# Patient Record
Sex: Female | Born: 1955 | Race: Black or African American | Hispanic: No | Marital: Married | Smoking: Never smoker
Health system: Southern US, Community
[De-identification: ages and names within clinical notes are randomized; demographics above are authoritative.]

## PROBLEM LIST (undated history)

## (undated) DIAGNOSIS — E739 Lactose intolerance, unspecified: Secondary | ICD-10-CM

## (undated) DIAGNOSIS — R7309 Other abnormal glucose: Secondary | ICD-10-CM

## (undated) DIAGNOSIS — F32A Depression, unspecified: Secondary | ICD-10-CM

## (undated) DIAGNOSIS — F419 Anxiety disorder, unspecified: Secondary | ICD-10-CM

## (undated) DIAGNOSIS — R7989 Other specified abnormal findings of blood chemistry: Secondary | ICD-10-CM

## (undated) DIAGNOSIS — M2550 Pain in unspecified joint: Secondary | ICD-10-CM

## (undated) DIAGNOSIS — Z9109 Other allergy status, other than to drugs and biological substances: Secondary | ICD-10-CM

## (undated) DIAGNOSIS — E559 Vitamin D deficiency, unspecified: Secondary | ICD-10-CM

## (undated) DIAGNOSIS — E669 Obesity, unspecified: Secondary | ICD-10-CM

## (undated) DIAGNOSIS — K589 Irritable bowel syndrome without diarrhea: Secondary | ICD-10-CM

## (undated) DIAGNOSIS — D219 Benign neoplasm of connective and other soft tissue, unspecified: Secondary | ICD-10-CM

## (undated) DIAGNOSIS — K829 Disease of gallbladder, unspecified: Secondary | ICD-10-CM

## (undated) DIAGNOSIS — R7303 Prediabetes: Secondary | ICD-10-CM

## (undated) DIAGNOSIS — K219 Gastro-esophageal reflux disease without esophagitis: Secondary | ICD-10-CM

## (undated) DIAGNOSIS — Z8659 Personal history of other mental and behavioral disorders: Secondary | ICD-10-CM

## (undated) DIAGNOSIS — Z8759 Personal history of other complications of pregnancy, childbirth and the puerperium: Secondary | ICD-10-CM

## (undated) HISTORY — DX: Personal history of other mental and behavioral disorders: Z86.59

## (undated) HISTORY — DX: Other specified abnormal findings of blood chemistry: R79.89

## (undated) HISTORY — DX: Other allergy status, other than to drugs and biological substances: Z91.09

## (undated) HISTORY — DX: Vitamin D deficiency, unspecified: E55.9

## (undated) HISTORY — DX: Disease of gallbladder, unspecified: K82.9

## (undated) HISTORY — DX: Irritable bowel syndrome, unspecified: K58.9

## (undated) HISTORY — DX: Gastro-esophageal reflux disease without esophagitis: K21.9

## (undated) HISTORY — DX: Prediabetes: R73.03

## (undated) HISTORY — DX: Lactose intolerance, unspecified: E73.9

## (undated) HISTORY — DX: Hypermagnesemia: E83.41

## (undated) HISTORY — DX: Obesity, unspecified: E66.9

## (undated) HISTORY — DX: Other abnormal glucose: R73.09

## (undated) HISTORY — DX: Pain in unspecified joint: M25.50

## (undated) HISTORY — DX: Benign neoplasm of connective and other soft tissue, unspecified: D21.9

## (undated) HISTORY — DX: Depression, unspecified: F32.A

## (undated) HISTORY — DX: Anxiety disorder, unspecified: F41.9

## (undated) HISTORY — DX: Personal history of other complications of pregnancy, childbirth and the puerperium: Z87.59

---

## 1985-06-11 DIAGNOSIS — Z8659 Personal history of other mental and behavioral disorders: Secondary | ICD-10-CM

## 1985-06-11 HISTORY — DX: Personal history of other mental and behavioral disorders: Z86.59

## 1998-10-03 ENCOUNTER — Other Ambulatory Visit: Admission: RE | Admit: 1998-10-03 | Discharge: 1998-10-03 | Payer: Self-pay | Admitting: *Deleted

## 1999-10-18 ENCOUNTER — Other Ambulatory Visit: Admission: RE | Admit: 1999-10-18 | Discharge: 1999-10-18 | Payer: Self-pay | Admitting: *Deleted

## 2000-10-24 ENCOUNTER — Other Ambulatory Visit: Admission: RE | Admit: 2000-10-24 | Discharge: 2000-10-24 | Payer: Self-pay | Admitting: *Deleted

## 2002-02-06 ENCOUNTER — Other Ambulatory Visit: Admission: RE | Admit: 2002-02-06 | Discharge: 2002-02-06 | Payer: Self-pay | Admitting: *Deleted

## 2003-06-09 ENCOUNTER — Other Ambulatory Visit: Admission: RE | Admit: 2003-06-09 | Discharge: 2003-06-09 | Payer: Self-pay | Admitting: *Deleted

## 2004-05-10 ENCOUNTER — Emergency Department (HOSPITAL_COMMUNITY): Admission: EM | Admit: 2004-05-10 | Discharge: 2004-05-10 | Payer: Self-pay | Admitting: Emergency Medicine

## 2004-06-12 ENCOUNTER — Other Ambulatory Visit: Admission: RE | Admit: 2004-06-12 | Discharge: 2004-06-12 | Payer: Self-pay | Admitting: *Deleted

## 2005-10-31 ENCOUNTER — Other Ambulatory Visit: Admission: RE | Admit: 2005-10-31 | Discharge: 2005-10-31 | Payer: Self-pay | Admitting: Obstetrics and Gynecology

## 2006-11-18 ENCOUNTER — Other Ambulatory Visit: Admission: RE | Admit: 2006-11-18 | Discharge: 2006-11-18 | Payer: Self-pay | Admitting: Obstetrics and Gynecology

## 2006-12-06 ENCOUNTER — Ambulatory Visit: Payer: Self-pay | Admitting: Internal Medicine

## 2006-12-19 ENCOUNTER — Ambulatory Visit: Payer: Self-pay | Admitting: Internal Medicine

## 2006-12-19 LAB — HM COLONOSCOPY

## 2007-11-26 ENCOUNTER — Other Ambulatory Visit: Admission: RE | Admit: 2007-11-26 | Discharge: 2007-11-26 | Payer: Self-pay | Admitting: Obstetrics and Gynecology

## 2012-04-07 LAB — HM PAP SMEAR: HM Pap smear: NEGATIVE

## 2012-04-07 LAB — HM MAMMOGRAPHY

## 2012-09-17 ENCOUNTER — Other Ambulatory Visit: Payer: Self-pay

## 2012-09-17 ENCOUNTER — Ambulatory Visit
Admission: RE | Admit: 2012-09-17 | Discharge: 2012-09-17 | Disposition: A | Discharge status: Home / Self Care | Payer: 59 | Source: Ambulatory Visit

## 2012-09-17 DIAGNOSIS — M25562 Pain in left knee: Secondary | ICD-10-CM

## 2012-10-27 ENCOUNTER — Telehealth: Payer: Self-pay | Admitting: Obstetrics and Gynecology

## 2012-10-27 NOTE — Telephone Encounter (Signed)
Patient would like

## 2012-11-26 ENCOUNTER — Telehealth: Payer: Self-pay | Admitting: Orthopedic Surgery

## 2012-11-26 NOTE — Telephone Encounter (Signed)
Spoke with pt about PMB. Pt states she had some bleeding last month for about 10 days, and hasn't had a regular period for about 18 months. Pt would like to see CR, but she is out of town until the 26th. Advised pt that CR is also out, but pt does not want to see another provider sooner. Pt agreeable to appt 12-09-12 at 1:45.

## 2012-12-04 ENCOUNTER — Encounter: Payer: Self-pay | Admitting: Obstetrics and Gynecology

## 2012-12-08 ENCOUNTER — Telehealth: Payer: Self-pay | Admitting: Obstetrics and Gynecology

## 2012-12-08 NOTE — Telephone Encounter (Signed)
Pt cancelled problem appt for tomorrow.says she is feeling better and does not need to come in right now.

## 2012-12-09 ENCOUNTER — Ambulatory Visit: Payer: Self-pay | Admitting: Obstetrics and Gynecology

## 2012-12-31 ENCOUNTER — Ambulatory Visit: Payer: Self-pay | Admitting: Obstetrics and Gynecology

## 2013-02-16 ENCOUNTER — Other Ambulatory Visit: Payer: Self-pay | Admitting: Internal Medicine

## 2013-02-16 DIAGNOSIS — R634 Abnormal weight loss: Secondary | ICD-10-CM

## 2013-02-16 DIAGNOSIS — R109 Unspecified abdominal pain: Secondary | ICD-10-CM

## 2013-02-17 ENCOUNTER — Ambulatory Visit
Admission: RE | Admit: 2013-02-17 | Discharge: 2013-02-17 | Disposition: A | Discharge status: Home / Self Care | Payer: 59 | Source: Ambulatory Visit | Attending: Internal Medicine | Admitting: Internal Medicine

## 2013-02-17 DIAGNOSIS — R634 Abnormal weight loss: Secondary | ICD-10-CM

## 2013-02-17 DIAGNOSIS — R109 Unspecified abdominal pain: Secondary | ICD-10-CM

## 2013-02-17 MED ORDER — IOHEXOL 300 MG/ML  SOLN
100.0000 mL | Freq: Once | INTRAMUSCULAR | Status: AC | PRN
  Administered 2013-02-17: 100 mL via INTRAVENOUS

## 2013-04-13 ENCOUNTER — Ambulatory Visit: Payer: Self-pay | Admitting: Obstetrics and Gynecology

## 2013-04-22 ENCOUNTER — Encounter: Payer: Self-pay | Admitting: Obstetrics and Gynecology

## 2013-04-22 ENCOUNTER — Ambulatory Visit (INDEPENDENT_AMBULATORY_CARE_PROVIDER_SITE_OTHER): Payer: 59 | Admitting: Obstetrics and Gynecology

## 2013-04-22 VITALS — BP 100/70 | HR 80 | Ht 61.75 in | Wt 169.0 lb

## 2013-04-22 DIAGNOSIS — Z01419 Encounter for gynecological examination (general) (routine) without abnormal findings: Secondary | ICD-10-CM

## 2013-04-22 DIAGNOSIS — Z0000 Encounter for general adult medical examination without abnormal findings: Secondary | ICD-10-CM

## 2013-04-22 LAB — POCT URINALYSIS DIPSTICK
Bilirubin, UA: NEGATIVE
Blood, UA: NEGATIVE
Glucose, UA: NEGATIVE
Ketones, UA: NEGATIVE
Leukocytes, UA: NEGATIVE
Nitrite, UA: NEGATIVE
Protein, UA: NEGATIVE
Urobilinogen, UA: NEGATIVE
pH, UA: 5

## 2013-04-22 NOTE — Progress Notes (Signed)
Patient ID: Tanya Weber, female   DOB: 09-01-1955, 57 y.o.   MRN: [id number] GYNECOLOGY VISIT  PCP:  Francesca Oman, MD  Referring provider:   HPI: 57 y.o.   Married  [place]  female   [phone] with Patient's last menstrual period was 11/10/2011.   here for  AEX.  No postmenopausal bleeding.   Has known uterine fibroids.  Last ultrasound 2010 - largest fibroid 3.0 cm.  Normal ovaries.   Hgb:     PCP Urine:   Neg  GYNECOLOGIC HISTORY: Patient's last menstrual period was 11/10/2011. Sexually active:  yes Partner preference: female Contraception:   vasectomy Menopausal hormone therapy: no DES exposure: no Blood transfusions:   no Sexually transmitted diseases:   no GYN Procedures:  C-section Mammogram:   04-07-12 MWU:[id number]---[id number] for mammogram 04-24-13 with [facility]              Pap:   04-07-12 wnl:neg HR HPV History of abnormal pap smear:  no   OB History   Grav Para Term Preterm Abortions TAB SAB Ect Mult Living   4 3 3  1  1   3        LIFESTYLE: Exercise:       Walking 3 times per week        Tobacco:        no Alcohol:           no Drug use:        no  OTHER HEALTH MAINTENANCE: Tetanus/TDap:    11/2006 Gardisil:   n/a Influenza:    Allergic to vaccine Zostavax:    n/a  Bone density:    n/a Colonoscopy:    12/2006 wnl: Dr. Stan Head.  Next colonoscopy due 12/2016.  Cholesterol check: 04-07-12 wnl  Family History  Problem Relation Age of Onset  . Heart attack Father 58    deceased  . Diabetes Brother 22    IDDM  . Hypertension Sister     There are no active problems to display for this patient.  Past Medical History  Diagnosis Date  . History of postpartum depression 1987  . Environmental allergies   . Fibroids     Past Surgical History  Procedure Laterality Date  . Cesarean section      ALLERGIES: Review of patient's allergies indicates no known allergies.  Current Outpatient Prescriptions  Medication Sig Dispense  Refill  . Loratadine (CLARITIN PO) Take by mouth.      . Magnesium 250 MG TABS Take by mouth.      . Multiple Vitamins-Minerals (MULTIVITAMIN PO) Take by mouth.      . Omega-3 Fatty Acids (FISH OIL PO) Take by mouth.       No current facility-administered medications for this visit.     ROS:  Pertinent items are noted in HPI.  SOCIAL HISTORY:    PHYSICAL EXAMINATION:    BP 100/70  Pulse 80  Ht 5' 1.75" (1.568 m)  Wt 169 lb (76.658 kg)  BMI 31.18 kg/m2  LMP 11/10/2011   Wt Readings from Last 3 Encounters:  04/22/13 169 lb (76.658 kg)     Ht Readings from Last 3 Encounters:  04/22/13 5' 1.75" (1.568 m)    General appearance: alert, cooperative and appears stated age Head: Normocephalic, without obvious abnormality, atraumatic Neck: no adenopathy, supple, symmetrical, trachea midline and thyroid not enlarged, symmetric, no tenderness/mass/nodules Lungs: clear to auscultation bilaterally Breasts: Inspection negative, No nipple retraction or dimpling, No nipple discharge or bleeding,  No axillary or supraclavicular adenopathy, Normal to palpation without dominant masses Heart: regular rate and rhythm Abdomen: soft, non-tender; no masses,  no organomegaly Extremities: extremities normal, atraumatic, no cyanosis or edema Skin: Skin color, texture, turgor normal. No rashes or lesions Lymph nodes: Cervical, supraclavicular, and axillary nodes normal. No abnormal inguinal nodes palpated Neurologic: Grossly normal  Pelvic: External genitalia:  no lesions              Urethra:  normal appearing urethra with no masses, tenderness or lesions              Bartholins and Skenes: normal                 Vagina: normal appearing vagina with normal color and discharge, no lesions              Cervix: normal appearance              Pap and high risk HPV testing done: no.            Bimanual Exam:  Uterus:  uterus is  8 week size  With palpable fibroids posteriorly and to the patient's left  fundus.                                       Adnexa: normal adnexa in size, nontender and no masses                                      Rectovaginal: Confirms                                      Anus:  normal sphincter tone, no lesions  ASSESSMENT  Uterine fibroids.  Asymptomatic.   PLAN  Mammogram yearly.  Scheduled for this month at [facility]  Pap smear and high risk HPV testing in 2018. Return annually or prn   An After Visit Summary was printed and given to the patient.

## 2013-04-22 NOTE — Patient Instructions (Signed)

## 2014-02-18 ENCOUNTER — Encounter: Payer: Self-pay | Admitting: Obstetrics and Gynecology

## 2014-02-26 ENCOUNTER — Encounter: Payer: Self-pay | Admitting: Obstetrics and Gynecology

## 2014-04-12 ENCOUNTER — Encounter: Payer: Self-pay | Admitting: Obstetrics and Gynecology

## 2014-04-26 ENCOUNTER — Ambulatory Visit: Payer: 59 | Admitting: Obstetrics and Gynecology

## 2014-05-05 ENCOUNTER — Telehealth: Payer: Self-pay | Admitting: Obstetrics and Gynecology

## 2014-05-05 ENCOUNTER — Ambulatory Visit: Payer: 59 | Admitting: Obstetrics and Gynecology

## 2014-05-10 ENCOUNTER — Telehealth: Payer: Self-pay | Admitting: Obstetrics and Gynecology

## 2014-05-10 NOTE — Telephone Encounter (Signed)
Phone call returned to patient on cell phone.  Patient unhappy with rescheduled appointments.   Patient states that communication with her is best through a phone call and not a letter.  She feels that a letter is too passive.  She will keep her appointment on December 14 at 1:30 pm but would prefer a first morning appointment if possible before the end of the year.   I told her that I would have our scheduler look at the possibilities and call her back either way.   I also assured her that we were reassessing our process for rescheduling appointments in order to serve patients better.  Patient was very appreciative of my call.

## 2014-05-24 ENCOUNTER — Ambulatory Visit (INDEPENDENT_AMBULATORY_CARE_PROVIDER_SITE_OTHER): Payer: 59 | Admitting: Obstetrics and Gynecology

## 2014-05-24 ENCOUNTER — Encounter: Payer: Self-pay | Admitting: Obstetrics and Gynecology

## 2014-05-24 VITALS — BP 100/68 | HR 66 | Resp 21 | Ht 61.75 in | Wt 157.0 lb

## 2014-05-24 DIAGNOSIS — Z01419 Encounter for gynecological examination (general) (routine) without abnormal findings: Secondary | ICD-10-CM

## 2014-05-24 DIAGNOSIS — Z0000 Encounter for general adult medical examination without abnormal findings: Secondary | ICD-10-CM

## 2014-05-24 LAB — CBC
HCT: 40.1 % (ref 36.0–46.0)
Hemoglobin: 13.4 g/dL (ref 12.0–15.0)
MCH: 28.3 pg (ref 26.0–34.0)
MCHC: 33.4 g/dL (ref 30.0–36.0)
MCV: 84.6 fL (ref 78.0–100.0)
MPV: 10.4 fL (ref 9.4–12.4)
Platelets: 186 10*3/uL (ref 150–400)
RBC: 4.74 MIL/uL (ref 3.87–5.11)
RDW: 15 % (ref 11.5–15.5)
WBC: 5 10*3/uL (ref 4.0–10.5)

## 2014-05-24 LAB — POCT URINALYSIS DIPSTICK
Bilirubin, UA: NEGATIVE
Blood, UA: NEGATIVE
Glucose, UA: NEGATIVE
Ketones, UA: NEGATIVE
Leukocytes, UA: NEGATIVE
Nitrite, UA: NEGATIVE
Protein, UA: NEGATIVE
Urobilinogen, UA: NEGATIVE
pH, UA: 5

## 2014-05-24 LAB — HEMOGLOBIN, FINGERSTICK: Hemoglobin, fingerstick: 13.1 g/dL (ref 12.0–16.0)

## 2014-05-24 MED ORDER — MAGNESIUM 250 MG PO TABS: 250.0000 mg | ORAL_TABLET | Freq: Every day | ORAL | Status: DC

## 2014-05-24 MED ORDER — LORATADINE 10 MG PO TABS: 10.0000 mg | ORAL_TABLET | Freq: Every day | ORAL | Status: DC | PRN

## 2014-05-24 MED ORDER — FISH OIL 500 MG PO CAPS: 500.0000 mg | ORAL_CAPSULE | Freq: Every day | ORAL | Status: DC

## 2014-05-24 MED ORDER — THERA VITAL M PO TABS: 1.0000 | ORAL_TABLET | Freq: Every day | ORAL | Status: DC

## 2014-05-24 NOTE — Addendum Note (Signed)
Addended by: Tacy Learn, BROOK E on: 05/24/2014 02:30 PM   Modules accepted: Orders

## 2014-05-24 NOTE — Patient Instructions (Signed)

## 2014-05-24 NOTE — Progress Notes (Addendum)
Patient ID: Tanya Weber, female   DOB: [birth year], 58 y.o.   MRN: [id number] 58 y.o. [id number] MarriedAfrican AmericanF here for annual exam.   Wants lab work, vit D, magnesium, cholesterol,metabolic profile, TSH, and CBC.   No postmenopausal bleeding.   Work and family doing well.  Going to [place].   Has known uterine fibroids. Last ultrasound 2010 - largest fibroid 3.0 cm. Normal ovaries.  PCP:  [facility] [place]  Patient's last menstrual period was 11/10/2011.          Sexually active: Yes.  female  The current method of family planning is vasectomy.    Exercising: Yes.    jogging 3x weekly for 1 hour. Smoker:  no  Health Maintenance: Pap:  04-07-12 wnl:neg HR HPV History of abnormal Pap:  no MMG:  04-24-13 fatty breasts/nl:[facility].  Has appt for this week.  Colonoscopy:  12/2006 wnl:Dr. Carlean Purl.  Next colonoscopy due 12/2016. BMD:   --- TDaP:  11/2006 Screening Labs:  Hb today: 13.1, Urine today: Neg   reports that she has never smoked. She does not have any smokeless tobacco history on file. She reports that she does not drink alcohol or use illicit drugs.  Past Medical History  Diagnosis Date  . History of postpartum depression 1987  . Environmental allergies   . Fibroids     Past Surgical History  Procedure Laterality Date  . Cesarean section      Current Outpatient Prescriptions  Medication Sig Dispense Refill  . Loratadine (CLARITIN PO) Take by mouth.    . Magnesium 250 MG TABS Take by mouth.    . Multiple Vitamins-Minerals (MULTIVITAMIN PO) Take by mouth.    . Omega-3 Fatty Acids (FISH OIL PO) Take by mouth.     No current facility-administered medications for this visit.    Family History  Problem Relation Age of Onset  . Heart attack Father 14    deceased  . Diabetes Brother 22    IDDM  . Hypertension Sister     ROS:  Pertinent items are noted in HPI.  Otherwise, a comprehensive ROS was negative.  Exam:   BP 100/68  mmHg  Pulse 66  Resp 21  Ht 5' 1.75" (1.568 m)  Wt 157 lb (71.215 kg)  BMI 28.97 kg/m2  LMP 11/10/2011     Height: 5' 1.75" (156.8 cm)  Ht Readings from Last 3 Encounters:  05/24/14 5' 1.75" (1.568 m)  04/22/13 5' 1.75" (1.568 m)    General appearance: alert, cooperative and appears stated age Head: Normocephalic, without obvious abnormality, atraumatic Neck: no adenopathy, supple, symmetrical, trachea midline and thyroid normal to inspection and palpation Lungs: clear to auscultation bilaterally Breasts: normal appearance, no masses or tenderness, Inspection negative, No nipple retraction or dimpling, No nipple discharge or bleeding, No axillary or supraclavicular adenopathy Heart: regular rate and rhythm Abdomen: soft, non-tender; bowel sounds normal; no masses,  no organomegaly Extremities: extremities normal, atraumatic, no cyanosis or edema Skin: Skin color, texture, turgor normal. No rashes or lesions Lymph nodes: Cervical, supraclavicular, and axillary nodes normal. No abnormal inguinal nodes palpated Neurologic: Grossly normal   Pelvic: External genitalia:  no lesions              Urethra:  normal appearing urethra with no masses, tenderness or lesions              Bartholins and Skenes: normal  Vagina: normal appearing vagina with normal color and discharge, no lesions              Cervix: no lesions              Pap taken: No. Bimanual Exam:  Uterus:  normal size, contour, position, consistency, mobility, non-tender              Adnexa: normal adnexa and no mass, fullness, tenderness               Rectovaginal: Confirms               Anus:  normal sphincter tone, no lesions  Chaperone was present for exam.  A:  Well Woman with normal exam Known fibroid.  Not palpable today.   P:   Mammogram yearly.  pap smear and HPV testing in 2016.  Routine labs - see orders.  return annually or prn  Addendum  Patient requested refills on her multivitamin,  Claritin, Omega 3 fatty acid, and magnesium for her health savings account card use. This was done.

## 2014-05-25 LAB — COMPREHENSIVE METABOLIC PANEL
ALT: 13 U/L (ref 0–35)
AST: 19 U/L (ref 0–37)
Albumin: 4.1 g/dL (ref 3.5–5.2)
Alkaline Phosphatase: 56 U/L (ref 39–117)
BUN: 15 mg/dL (ref 6–23)
CO2: 26 mEq/L (ref 19–32)
Calcium: 9.5 mg/dL (ref 8.4–10.5)
Chloride: 105 mEq/L (ref 96–112)
Creat: 0.71 mg/dL (ref 0.50–1.10)
Glucose, Bld: 76 mg/dL (ref 70–99)
Potassium: 4.3 mEq/L (ref 3.5–5.3)
Sodium: 139 mEq/L (ref 135–145)
Total Bilirubin: 0.5 mg/dL (ref 0.2–1.2)
Total Protein: 6.8 g/dL (ref 6.0–8.3)

## 2014-05-25 LAB — LIPID PANEL
Cholesterol: 163 mg/dL (ref 0–200)
HDL: 62 mg/dL (ref >39)
LDL Cholesterol: 89 mg/dL (ref 0–99)
Total CHOL/HDL Ratio: 2.6 Ratio
Triglycerides: 59 mg/dL (ref <150)
VLDL: 12 mg/dL (ref 0–40)

## 2014-05-25 LAB — MAGNESIUM: Magnesium: 2.1 mg/dL (ref 1.5–2.5)

## 2014-05-25 LAB — VITAMIN D 25 HYDROXY (VIT D DEFICIENCY, FRACTURES): Vit D, 25-Hydroxy: 31 ng/mL (ref 30–100)

## 2014-05-25 LAB — TSH: TSH: 2.171 u[IU]/mL (ref 0.350–4.500)

## 2015-06-15 ENCOUNTER — Ambulatory Visit (INDEPENDENT_AMBULATORY_CARE_PROVIDER_SITE_OTHER): Payer: 59 | Admitting: Obstetrics and Gynecology

## 2015-06-15 ENCOUNTER — Encounter: Payer: Self-pay | Admitting: Obstetrics and Gynecology

## 2015-06-15 VITALS — BP 112/62 | HR 74 | Resp 18 | Ht 62.0 in | Wt 161.0 lb

## 2015-06-15 DIAGNOSIS — Z01419 Encounter for gynecological examination (general) (routine) without abnormal findings: Secondary | ICD-10-CM | POA: Diagnosis not present

## 2015-06-15 DIAGNOSIS — R252 Cramp and spasm: Secondary | ICD-10-CM

## 2015-06-15 DIAGNOSIS — Z0000 Encounter for general adult medical examination without abnormal findings: Secondary | ICD-10-CM

## 2015-06-15 LAB — CBC
HCT: 43.5 % (ref 36.0–46.0)
Hemoglobin: 14 g/dL (ref 12.0–15.0)
MCH: 28.1 pg (ref 26.0–34.0)
MCHC: 32.2 g/dL (ref 30.0–36.0)
MCV: 87.2 fL (ref 78.0–100.0)
MPV: 9.9 fL (ref 8.6–12.4)
Platelets: 180 10*3/uL (ref 150–400)
RBC: 4.99 MIL/uL (ref 3.87–5.11)
RDW: 15.1 % (ref 11.5–15.5)
WBC: 4.1 10*3/uL (ref 4.0–10.5)

## 2015-06-15 LAB — POCT URINALYSIS DIPSTICK
Bilirubin, UA: NEGATIVE
Blood, UA: NEGATIVE
Glucose, UA: NEGATIVE
Ketones, UA: NEGATIVE
Leukocytes, UA: NEGATIVE
Nitrite, UA: NEGATIVE
Protein, UA: NEGATIVE
Urobilinogen, UA: NEGATIVE
pH, UA: 5

## 2015-06-15 LAB — TSH: TSH: 3.804 u[IU]/mL (ref 0.350–4.500)

## 2015-06-15 LAB — HEMOGLOBIN, FINGERSTICK: Hemoglobin, fingerstick: 13.8 g/dL (ref 12.0–16.0)

## 2015-06-15 NOTE — Progress Notes (Signed)
60 y.o. [id number] Married [place] American female here for annual exam.    Sharp pain in the right lower quadrant comes and goes when "exicted."  Once every 2 months.  No change in bowel function.  No postmenopausal bleeding.  Has known fibroids.   Has cramps in legs with exercise.  Takes vit D 2000 IU three times a week.  Uses soy milk.  Wants lipids, TSH, CMP, CBC, magnesium, vit D.  Is a clinician.   PCP:   [facility]      Patient's last menstrual period was 11/10/2011.          Sexually active: Yes.    The current method of family planning is post menopausal status.    Exercising: Yes.    Walking Smoker:  no  Health Maintenance: Pap:  04/07/12 Neg. HR HPV:neg History of abnormal Pap:  no MMG:  05/31/15 BIRADS1:neg Colonoscopy:  12/2006 Repeat 10 years  BMD:   Never  TDaP: 11/2006 Screening Labs:  Hb today:  13.8, Urine today: Negative   reports that she has never smoked. She has never used smokeless tobacco. She reports that she does not drink alcohol or use illicit drugs.  Past Medical History  Diagnosis Date  . History of postpartum depression 1987  . Environmental allergies   . Fibroids     Past Surgical History  Procedure Laterality Date  . Cesarean section      Current Outpatient Prescriptions  Medication Sig Dispense Refill  . cholecalciferol (VITAMIN D) 1000 units tablet Take 1,000 Units by mouth daily.    Marland Kitchen loratadine (CLARITIN) 10 MG tablet Take 1 tablet (10 mg total) by mouth daily as needed. 30 tablet 11  . Magnesium 250 MG TABS Take 1 tablet (250 mg total) by mouth daily. 30 tablet 11  . Multiple Vitamins-Minerals (MULTIVITAMIN) tablet Take 1 tablet by mouth daily. 30 tablet 11  . Omega-3 Fatty Acids (FISH OIL) 500 MG CAPS Take 1 capsule (500 mg total) by mouth daily. 30 capsule 11   No current facility-administered medications for this visit.    Family History  Problem Relation Age of Onset  . Heart attack Father 76    deceased  .  Diabetes Brother 22    IDDM  . Hypertension Sister     ROS:  Pertinent items are noted in HPI.  Otherwise, a comprehensive ROS was negative.  Exam:   BP 112/62 mmHg  Pulse 74  Resp 18  Ht 5\' 2"  (1.575 m)  Wt 161 lb (73.029 kg)  BMI 29.44 kg/m2  LMP 11/10/2011    General appearance: alert, cooperative and appears stated age Head: Normocephalic, without obvious abnormality, atraumatic Neck: no adenopathy, supple, symmetrical, trachea midline and thyroid normal to inspection and palpation Lungs: clear to auscultation bilaterally Breasts: normal appearance, no masses or tenderness, Inspection negative, No nipple retraction or dimpling, No nipple discharge or bleeding, No axillary or supraclavicular adenopathy Heart: regular rate and rhythm Abdomen: Pfannenstiel incision, soft, non-tender; bowel sounds normal; no masses,  no organomegaly Extremities: extremities normal, atraumatic, no cyanosis or edema Skin: Skin color, texture, turgor normal. No rashes or lesions Lymph nodes: Cervical, supraclavicular, and axillary nodes normal. No abnormal inguinal nodes palpated Neurologic: Grossly normal  Pelvic: External genitalia:  no lesions              Urethra:  normal appearing urethra with no masses, tenderness or lesions              Bartholins and Skenes:  normal                 Vagina: normal appearing vagina with normal color and discharge, no lesions              Cervix: no lesions              Pap taken: Yes.   Bimanual Exam:  Uterus:  normal size, contour, position, consistency, mobility, non-tender              Adnexa: normal adnexa and no mass, fullness, tenderness              Rectovaginal: Yes.  .  Confirms.              Anus:  normal sphincter tone, no lesions  Chaperone was present for exam.  Assessment:   Well woman visit with normal exam. Right lower quadrant pain.  Nonspecific.  Hx fibroids. Leg cramping.   Plan: Yearly mammogram recommended after age 55.   Recommended self breast exam.  Pap and HR HPV as above. Discussed Calcium, Vitamin D, regular exercise program including cardiovascular and weight bearing exercise. Labs performed.  Yes.  .   See orders.  Routine labs and magnesium added. Refills given on medications.  No..  See orders. Return for increased abdominal pain, occurrence of vaginal bleeding or any other concern.  Follow up annually and prn.      After visit summary provided.

## 2015-06-15 NOTE — Patient Instructions (Signed)

## 2015-06-16 LAB — LIPID PANEL
Cholesterol: 202 mg/dL — ABNORMAL HIGH (ref 125–200)
HDL: 82 mg/dL (ref >=46)
LDL Cholesterol: 108 mg/dL (ref <130)
Total CHOL/HDL Ratio: 2.5 Ratio (ref <=5.0)
Triglycerides: 60 mg/dL (ref <150)
VLDL: 12 mg/dL (ref <30)

## 2015-06-16 LAB — COMPREHENSIVE METABOLIC PANEL
ALT: 16 U/L (ref 6–29)
AST: 21 U/L (ref 10–35)
Albumin: 4.2 g/dL (ref 3.6–5.1)
Alkaline Phosphatase: 55 U/L (ref 33–130)
BUN: 16 mg/dL (ref 7–25)
CO2: 26 mmol/L (ref 20–31)
Calcium: 9.2 mg/dL (ref 8.6–10.4)
Chloride: 107 mmol/L (ref 98–110)
Creat: 0.8 mg/dL (ref 0.50–1.05)
Glucose, Bld: 79 mg/dL (ref 65–99)
Potassium: 3.8 mmol/L (ref 3.5–5.3)
Sodium: 142 mmol/L (ref 135–146)
Total Bilirubin: 0.5 mg/dL (ref 0.2–1.2)
Total Protein: 6.9 g/dL (ref 6.1–8.1)

## 2015-06-16 LAB — VITAMIN D 25 HYDROXY (VIT D DEFICIENCY, FRACTURES): Vit D, 25-Hydroxy: 44 ng/mL (ref 30–100)

## 2015-06-16 LAB — MAGNESIUM: Magnesium: 2.1 mg/dL (ref 1.5–2.5)

## 2015-06-17 LAB — IPS PAP TEST WITH HPV

## 2015-06-21 ENCOUNTER — Telehealth: Payer: Self-pay | Admitting: Emergency Medicine

## 2015-06-21 ENCOUNTER — Encounter: Payer: Self-pay | Admitting: Obstetrics and Gynecology

## 2015-06-21 NOTE — Telephone Encounter (Signed)
-----   Message from Nunzio Cobbs, MD sent at 06/16/2015  8:17 PM EST ----- Please inform patient of her lab results. Her total cholesterol was 202, but this is because her HDL was so high at 82.  Great job! Her TSH, magnesium, vit D, CBC, and CMP were all normal.  I am very pleased with her blood work.   Pap is still pending.

## 2015-06-21 NOTE — Telephone Encounter (Signed)
-----   Message from Nunzio Cobbs, MD sent at 06/18/2015  2:29 PM EST ----- Please inform patient of lab results: Pap showed LGSIL and negative HR HPV.  Preferred management according to ASCCP guidelines is costesting in one year.  Please enter recall - 08.   Cc- Marisa Sprinkles

## 2015-06-21 NOTE — Telephone Encounter (Signed)
Patient contacted and informed of lab results and pap smear results, LGSIL.  Follow up in one year.  08 recall in place, patient has annual exam scheduled.  Verbalizes understanding of all results and importance for pap smear in one year.  Will call back with any further concerns.  Routing to provider for final review. Patient agreeable to disposition. Will close encounter.

## 2015-06-22 NOTE — Telephone Encounter (Signed)
Responded to patient via mychart.  Route to provider for review.  Will close [employer].

## 2016-04-26 ENCOUNTER — Other Ambulatory Visit: Payer: Self-pay | Admitting: Occupational Medicine

## 2016-04-26 ENCOUNTER — Ambulatory Visit: Payer: Self-pay

## 2016-04-26 DIAGNOSIS — Z0000 Encounter for general adult medical examination without abnormal findings: Secondary | ICD-10-CM

## 2016-06-06 ENCOUNTER — Encounter: Payer: Self-pay | Admitting: Obstetrics and Gynecology

## 2016-07-05 NOTE — Progress Notes (Signed)
61 y.o. [id number] Married [place] American female here for annual exam.    Ate some spicy food 3 days ago. and having abdominal pain.  Pap last year was LGSIL and negative HR HPV.   Wants screening labs today.   PCP: [facility]   Patient's last menstrual period was 11/10/2011.           Sexually active: Yes.    The current method of family planning is post menopausal status.    Exercising: Yes.    walking Smoker:  no  Health Maintenance: Pap:  06-15-15 LGSIL:Neg HR HPV History of abnormal Pap:  Ye, 06-15-15 LGSIL:Neg HR HPV MMG:  05-17-16 Density A/Neg/BiRads1:[facility] Colonoscopy:  12/2006 normal;next due 12/2016 BMD:  N/A TDaP:  11/2006 HIV: unsure Hep C: unsure Screening Labs:  Hb today: 13.3, Urine today: RBC= Small, WBC=Trace.  Asymptomatic.   reports that she has never smoked. She has never used smokeless tobacco. She reports that she does not drink alcohol or use drugs.  Past Medical History:  Diagnosis Date  . Environmental allergies   . Fibroids   . History of postpartum depression 1987    Past Surgical History:  Procedure Laterality Date  . CESAREAN SECTION      Current Outpatient Prescriptions  Medication Sig Dispense Refill  . cholecalciferol (VITAMIN D) 1000 units tablet Take 1,000 Units by mouth daily.    Marland Kitchen loratadine (CLARITIN) 10 MG tablet Take 1 tablet (10 mg total) by mouth daily as needed. 30 tablet 11  . Magnesium 250 MG TABS Take 1 tablet (250 mg total) by mouth daily. 30 tablet 11  . Multiple Vitamins-Minerals (MULTIVITAMIN) tablet Take 1 tablet by mouth daily. 30 tablet 11  . Omega-3 Fatty Acids (FISH OIL) 500 MG CAPS Take 1 capsule (500 mg total) by mouth daily. 30 capsule 11   No current facility-administered medications for this visit.     Family History  Problem Relation Age of Onset  . Heart attack Father 61    deceased  . Diabetes Brother 22    IDDM  . Hypertension Sister     ROS:  Pertinent items are noted in HPI.  Otherwise, a  comprehensive ROS was negative.  Exam:   BP 104/70 (BP Location: Right Arm, Patient Position: Sitting, Cuff Size: Large)   Pulse 76   Resp 18   Ht 5\' 2"  (1.575 m)   Wt 168 lb (76.2 kg)   LMP 11/10/2011   BMI 30.73 kg/m     General appearance: alert, cooperative and appears stated age Head: Normocephalic, without obvious abnormality, atraumatic Neck: no adenopathy, supple, symmetrical, trachea midline and thyroid normal to inspection and palpation Lungs: clear to auscultation bilaterally Breasts: normal appearance, no masses or tenderness, No nipple retraction or dimpling, No nipple discharge or bleeding, No axillary or supraclavicular adenopathy Heart: regular rate and rhythm Abdomen: soft, non-tender; no masses, no organomegaly Extremities: extremities normal, atraumatic, no cyanosis or edema Skin: Skin color, texture, turgor normal. No rashes or lesions Lymph nodes: Cervical, supraclavicular, and axillary nodes normal. No abnormal inguinal nodes palpated Neurologic: Grossly normal  Pelvic: External genitalia:  no lesions              Urethra:  normal appearing urethra with no masses, tenderness or lesions              Bartholins and Skenes: normal                 Vagina: normal appearing vagina with normal color  and discharge, no lesions              Cervix: no lesions              Pap taken: Yes.   Bimanual Exam:  Uterus:  normal size, contour, position, consistency, mobility, non-tender              Adnexa: no mass, fullness, tenderness              Rectal exam: Yes.  .  Confirms.              Anus:  normal sphincter tone, no lesions  Chaperone was present for exam.  Assessment:   Well woman visit with normal exam. Hx fibroids.  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  Microscopic hematuria.   Plan: Mammogram screening discussed. Recommended self breast awareness. Pap and HR HPV performed according to ASCCP guidelines.   If pap abnormal or positive HR HPV, needs colpo.   This explained to patient.  Routine labs including hep C and HIV. Urine micro and culture. TDap today.  She will contact [facility] to schedule colonoscopy.  Guidelines for Calcium, Vitamin D, regular exercise program including cardiovascular and weight bearing exercise.   Follow up annually and prn.        After visit summary provided.

## 2016-07-06 ENCOUNTER — Other Ambulatory Visit: Payer: Self-pay | Admitting: Obstetrics and Gynecology

## 2016-07-06 ENCOUNTER — Encounter: Payer: Self-pay | Admitting: Obstetrics and Gynecology

## 2016-07-06 ENCOUNTER — Ambulatory Visit (INDEPENDENT_AMBULATORY_CARE_PROVIDER_SITE_OTHER): Payer: 59 | Admitting: Obstetrics and Gynecology

## 2016-07-06 VITALS — BP 104/70 | HR 76 | Resp 18 | Ht 62.0 in | Wt 168.0 lb

## 2016-07-06 DIAGNOSIS — Z23 Encounter for immunization: Secondary | ICD-10-CM

## 2016-07-06 DIAGNOSIS — R3129 Other microscopic hematuria: Secondary | ICD-10-CM

## 2016-07-06 DIAGNOSIS — Z119 Encounter for screening for infectious and parasitic diseases, unspecified: Secondary | ICD-10-CM | POA: Diagnosis not present

## 2016-07-06 DIAGNOSIS — Z01419 Encounter for gynecological examination (general) (routine) without abnormal findings: Secondary | ICD-10-CM | POA: Diagnosis not present

## 2016-07-06 DIAGNOSIS — Z0000 Encounter for general adult medical examination without abnormal findings: Secondary | ICD-10-CM | POA: Diagnosis not present

## 2016-07-06 DIAGNOSIS — R7989 Other specified abnormal findings of blood chemistry: Secondary | ICD-10-CM

## 2016-07-06 LAB — POCT URINALYSIS DIPSTICK
Bilirubin, UA: NEGATIVE
Glucose, UA: NEGATIVE
Ketones, UA: NEGATIVE
Nitrite, UA: NEGATIVE
Protein, UA: NEGATIVE
Urobilinogen, UA: NEGATIVE
pH, UA: 5

## 2016-07-06 LAB — CBC
HCT: 43.9 % (ref 35.0–45.0)
Hemoglobin: 14 g/dL (ref 11.7–15.5)
MCH: 28.5 pg (ref 27.0–33.0)
MCHC: 31.9 g/dL — ABNORMAL LOW (ref 32.0–36.0)
MCV: 89.4 fL (ref 80.0–100.0)
MPV: 10.7 fL (ref 7.5–12.5)
PLATELETS: 177 10*3/uL (ref 140–400)
RBC: 4.91 MIL/uL (ref 3.80–5.10)
RDW: 14.5 % (ref 11.0–15.0)
WBC: 5 10*3/uL (ref 3.8–10.8)

## 2016-07-06 LAB — LIPID PANEL
Cholesterol: 197 mg/dL (ref <200)
HDL: 81 mg/dL (ref >50)
LDL Cholesterol: 105 mg/dL — ABNORMAL HIGH (ref <100)
Total CHOL/HDL Ratio: 2.4 Ratio (ref <5.0)
Triglycerides: 54 mg/dL (ref <150)
VLDL: 11 mg/dL (ref <30)

## 2016-07-06 LAB — COMPREHENSIVE METABOLIC PANEL
ALK PHOS: 57 U/L (ref 33–130)
ALT: 18 U/L (ref 6–29)
AST: 21 U/L (ref 10–35)
Albumin: 4.1 g/dL (ref 3.6–5.1)
BUN: 14 mg/dL (ref 7–25)
CHLORIDE: 106 mmol/L (ref 98–110)
CO2: 26 mmol/L (ref 20–31)
CREATININE: 0.83 mg/dL (ref 0.50–1.05)
Calcium: 9.7 mg/dL (ref 8.6–10.4)
GLUCOSE: 92 mg/dL (ref 65–99)
Potassium: 4.4 mmol/L (ref 3.5–5.3)
SODIUM: 139 mmol/L (ref 135–146)
TOTAL PROTEIN: 7.3 g/dL (ref 6.1–8.1)
Total Bilirubin: 0.5 mg/dL (ref 0.2–1.2)

## 2016-07-06 LAB — HEMOGLOBIN, FINGERSTICK: Hemoglobin, fingerstick: 13.3 g/dL (ref 12.0–15.0)

## 2016-07-06 LAB — HEPATITIS C ANTIBODY: HCV AB: NEGATIVE

## 2016-07-06 LAB — TSH: TSH: 4.8 mIU/L — ABNORMAL HIGH

## 2016-07-06 NOTE — Patient Instructions (Signed)

## 2016-07-07 LAB — URINALYSIS, MICROSCOPIC ONLY
BACTERIA UA: NONE SEEN [HPF]
Casts: NONE SEEN [LPF]
Crystals: NONE SEEN [HPF]
RBC / HPF: NONE SEEN RBC/HPF (ref <=2)
Squamous Epithelial / LPF: NONE SEEN [HPF] (ref <=5)
YEAST: NONE SEEN [HPF]

## 2016-07-07 LAB — VITAMIN D 25 HYDROXY (VIT D DEFICIENCY, FRACTURES): VIT D 25 HYDROXY: 41 ng/mL (ref 30–100)

## 2016-07-07 LAB — HIV ANTIBODY (ROUTINE TESTING W REFLEX): HIV 1&2 Ab, 4th Generation: NONREACTIVE

## 2016-07-08 LAB — URINE CULTURE: Organism ID, Bacteria: NO GROWTH

## 2016-07-09 ENCOUNTER — Other Ambulatory Visit: Payer: Self-pay | Admitting: *Deleted

## 2016-07-09 LAB — T4, FREE: FREE T4: 1.1 ng/dL (ref 0.8–1.8)

## 2016-07-09 LAB — T3, FREE: T3 FREE: 3.1 pg/mL (ref 2.3–4.2)

## 2016-07-10 NOTE — Addendum Note (Signed)
Addended by: Yisroel Ramming, Dietrich Pates E on: 07/10/2016 07:06 PM   Modules accepted: Orders

## 2016-07-11 ENCOUNTER — Telehealth: Payer: Self-pay

## 2016-07-11 NOTE — Telephone Encounter (Signed)
Spoke with patient. Advised of message and results as seen below from [place]. Patient is agreeable and verbalizes understanding. Patient is driving and is not able to make a follow up lab appointment at this time. She will return call to the office to schedule 3 month recheck at the end of April.  Routing to provider for final review. Patient agreeable to disposition. Will close encounter.

## 2016-07-11 NOTE — Telephone Encounter (Signed)
-----   Message from Nunzio Cobbs, MD sent at 07/10/2016  7:06 PM EST ----- Results to patient through My Chart. Please call to schedule lab visit for 3 months.   Hello Tanya Weber,   I am sharing good news that you free T4 and free T3 are normal.  In light of your slightly elevated TSH, I am recommending that you return for a lab visit to recheck your thyroid function in 3 months.  We want to understand if you are developing hypothyroidism.  I will have the nurse call you to schedule this lab visit.   Thank you,   Josefa Half, MD  Cc- Marisa Sprinkles

## 2016-07-12 LAB — IPS PAP TEST WITH HPV

## 2016-07-13 ENCOUNTER — Encounter: Payer: Self-pay | Admitting: Obstetrics and Gynecology

## 2016-07-17 ENCOUNTER — Other Ambulatory Visit: Payer: Self-pay | Admitting: Obstetrics and Gynecology

## 2016-07-17 DIAGNOSIS — Z77018 Contact with and (suspected) exposure to other hazardous metals: Secondary | ICD-10-CM

## 2016-10-08 ENCOUNTER — Other Ambulatory Visit (INDEPENDENT_AMBULATORY_CARE_PROVIDER_SITE_OTHER): Payer: 59

## 2016-10-08 DIAGNOSIS — R7989 Other specified abnormal findings of blood chemistry: Secondary | ICD-10-CM

## 2016-10-08 DIAGNOSIS — Z77018 Contact with and (suspected) exposure to other hazardous metals: Secondary | ICD-10-CM

## 2016-10-08 DIAGNOSIS — R946 Abnormal results of thyroid function studies: Secondary | ICD-10-CM

## 2016-10-09 LAB — THYROID PANEL WITH TSH
FREE THYROXINE INDEX: 2.5 (ref 1.4–3.8)
T3 Uptake: 30 % (ref 22–35)
T4, Total: 8.2 ug/dL (ref 4.5–12.0)
TSH: 4.22 m[IU]/L

## 2016-10-10 LAB — HEAVY METALS PANEL, BLOOD
ARSENIC: 4 ug/L (ref <23)
Lead: <1 ug/dL (ref <5)
Mercury, B: 4 mcg/L (ref <=10)

## 2017-02-05 ENCOUNTER — Encounter: Payer: Self-pay | Admitting: Internal Medicine

## 2017-04-26 ENCOUNTER — Other Ambulatory Visit: Payer: Self-pay | Admitting: Obstetrics and Gynecology

## 2017-04-26 DIAGNOSIS — Z139 Encounter for screening, unspecified: Secondary | ICD-10-CM

## 2017-06-07 ENCOUNTER — Ambulatory Visit
Admission: RE | Admit: 2017-06-07 | Discharge: 2017-06-07 | Disposition: A | Discharge status: Home / Self Care | Payer: 59 | Source: Ambulatory Visit | Attending: Obstetrics and Gynecology | Admitting: Obstetrics and Gynecology

## 2017-06-07 DIAGNOSIS — Z139 Encounter for screening, unspecified: Secondary | ICD-10-CM

## 2017-06-11 HISTORY — DX: Hypermagnesemia: E83.41

## 2017-07-19 ENCOUNTER — Encounter: Payer: Self-pay | Admitting: Obstetrics and Gynecology

## 2017-07-19 ENCOUNTER — Ambulatory Visit (INDEPENDENT_AMBULATORY_CARE_PROVIDER_SITE_OTHER): Payer: 59 | Admitting: Obstetrics and Gynecology

## 2017-07-19 ENCOUNTER — Other Ambulatory Visit (HOSPITAL_COMMUNITY)
Admission: RE | Admit: 2017-07-19 | Discharge: 2017-07-19 | Disposition: A | Discharge status: Home / Self Care | Payer: 59 | Source: Ambulatory Visit | Attending: Obstetrics and Gynecology | Admitting: Obstetrics and Gynecology

## 2017-07-19 ENCOUNTER — Other Ambulatory Visit: Payer: Self-pay

## 2017-07-19 VITALS — BP 118/70 | HR 84 | Resp 16 | Ht 62.0 in | Wt 166.0 lb

## 2017-07-19 DIAGNOSIS — F419 Anxiety disorder, unspecified: Secondary | ICD-10-CM

## 2017-07-19 DIAGNOSIS — G4700 Insomnia, unspecified: Secondary | ICD-10-CM | POA: Insufficient documentation

## 2017-07-19 DIAGNOSIS — Z1151 Encounter for screening for human papillomavirus (HPV): Secondary | ICD-10-CM | POA: Insufficient documentation

## 2017-07-19 DIAGNOSIS — Z833 Family history of diabetes mellitus: Secondary | ICD-10-CM | POA: Diagnosis not present

## 2017-07-19 DIAGNOSIS — Z634 Disappearance and death of family member: Secondary | ICD-10-CM | POA: Diagnosis not present

## 2017-07-19 DIAGNOSIS — N952 Postmenopausal atrophic vaginitis: Secondary | ICD-10-CM | POA: Diagnosis not present

## 2017-07-19 DIAGNOSIS — Z01419 Encounter for gynecological examination (general) (routine) without abnormal findings: Secondary | ICD-10-CM

## 2017-07-19 DIAGNOSIS — Z8249 Family history of ischemic heart disease and other diseases of the circulatory system: Secondary | ICD-10-CM | POA: Insufficient documentation

## 2017-07-19 DIAGNOSIS — Z8742 Personal history of other diseases of the female genital tract: Secondary | ICD-10-CM | POA: Insufficient documentation

## 2017-07-19 DIAGNOSIS — R7989 Other specified abnormal findings of blood chemistry: Secondary | ICD-10-CM

## 2017-07-19 HISTORY — DX: Other specified abnormal findings of blood chemistry: R79.89

## 2017-07-19 NOTE — Progress Notes (Addendum)
62 y.o. [id number] Married [place] American female here for annual exam.    More vaginal dryness.   Sleeping less.  Niece died suddenly.   Thinks this had something to do with it.  Feeling anxious about this.  Asking for specific labs today.   PCP:    Dr. Deniece Ree [facility]  Patient's last menstrual period was 11/10/2011.           Sexually active: Yes.    The current method of family planning is post menopausal status.    Exercising: Yes.    walking, weights, zumba Smoker:  no  Health Maintenance: Pap:  07/06/16 Pap and HR HPV negative History of abnormal Pap:  Yes, 06-15-15 LGSIL:Neg HR HPV MMG:  06/07/17 BIRADS 1 negative/density b Colonoscopy:  Cologuard done 2018 per patient normal BMD:   n/a  Result  n/a TDaP:  11/17/16 Gardasil:   n/a HIV and Hep C: 07/06/16 Negative Screening Labs:  Discuss today   reports that  has never smoked. she has never used smokeless tobacco. She reports that she does not drink alcohol or use drugs.  Past Medical History:  Diagnosis Date  . Environmental allergies   . Fibroids   . History of postpartum depression 1987    Past Surgical History:  Procedure Laterality Date  . CESAREAN SECTION      Current Outpatient Medications  Medication Sig Dispense Refill  . cholecalciferol (VITAMIN D) 1000 units tablet Take 1,000 Units by mouth daily.    Marland Kitchen loratadine (CLARITIN) 10 MG tablet Take 1 tablet (10 mg total) by mouth daily as needed. 30 tablet 11  . Magnesium 250 MG TABS Take 1 tablet (250 mg total) by mouth daily. 30 tablet 11  . Multiple Vitamins-Minerals (MULTIVITAMIN) tablet Take 1 tablet by mouth daily. 30 tablet 11  . Omega-3 Fatty Acids (FISH OIL) 500 MG CAPS Take 1 capsule (500 mg total) by mouth daily. 30 capsule 11   No current facility-administered medications for this visit.     Family History  Problem Relation Age of Onset  . Heart attack Father 54       deceased  . Diabetes Brother 22       IDDM  . Hypertension  Sister     ROS:  Pertinent items are noted in HPI.  Otherwise, a comprehensive ROS was negative.  Exam:   BP 118/70 (BP Location: Right Arm, Patient Position: Sitting, Cuff Size: Large)   Pulse 84   Resp 16   Ht 5\' 2"  (1.575 m)   Wt 166 lb (75.3 kg)   LMP 11/10/2011   BMI 30.36 kg/m     General appearance: alert, cooperative and appears stated age Head: Normocephalic, without obvious abnormality, atraumatic Neck: no adenopathy, supple, symmetrical, trachea midline and thyroid normal to inspection and palpation Lungs: clear to auscultation bilaterally Breasts: normal appearance, no masses or tenderness, No nipple retraction or dimpling, No nipple discharge or bleeding, No axillary or supraclavicular adenopathy Heart: regular rate and rhythm Abdomen: soft, non-tender; no masses, no organomegaly Extremities: extremities normal, atraumatic, no cyanosis or edema Skin: Skin color, texture, turgor normal. No rashes or lesions Lymph nodes: Cervical, supraclavicular, and axillary nodes normal. No abnormal inguinal nodes palpated Neurologic: Grossly normal  Pelvic: External genitalia:  7 mm left labia majora subcutaneous cyst.               Urethra:  normal appearing urethra with no masses, tenderness or lesions  Bartholins and Skenes: normal                 Vagina: normal appearing vagina with normal color and discharge, no lesions              Cervix: no lesions              Pap taken: Yes.   Bimanual Exam:  Uterus:  normal size, contour, position, consistency, mobility, non-tender              Adnexa: no mass, fullness, tenderness              Rectal exam: Yes.  .  Confirms.              Anus:  normal sphincter tone, no lesions  Chaperone was present for exam.  Assessment:   Well woman visit with normal exam. Hx fibroids.  Hx LGSIL and neg HR HPV. Vaginal atrophy.  Bereavement with insomnia.  Plan: Mammogram screening discussed. Recommended self breast  awareness. Pap and HR HPV as above. Guidelines for Calcium, Vitamin D, regular exercise program including cardiovascular and weight bearing exercise. Routine labs and Mg, B12, and testosterone per request.  Discussed cooking oils, vit E suppositories, and vaginal estrogen.  Declines vaginal estrogen.  I did discuss the potential for breast cancer with this. Bereavement support.  I recommended counseling for loss through Hospice. Follow up annually and prn.   After visit summary provided.

## 2017-07-19 NOTE — Patient Instructions (Signed)

## 2017-07-22 ENCOUNTER — Other Ambulatory Visit: Payer: Self-pay | Admitting: Obstetrics and Gynecology

## 2017-07-22 ENCOUNTER — Encounter: Payer: Self-pay | Admitting: Obstetrics and Gynecology

## 2017-07-23 LAB — CYTOLOGY - PAP
Diagnosis: NEGATIVE
HPV (WINDOPATH): NOT DETECTED

## 2017-07-24 LAB — COMPREHENSIVE METABOLIC PANEL
A/G RATIO: 1.4 (ref 1.2–2.2)
ALBUMIN: 4.2 g/dL (ref 3.6–4.8)
ALT: 13 IU/L (ref 0–32)
AST: 18 IU/L (ref 0–40)
Alkaline Phosphatase: 64 IU/L (ref 39–117)
BUN / CREAT RATIO: 13 (ref 12–28)
BUN: 12 mg/dL (ref 8–27)
CO2: 21 mmol/L (ref 20–29)
Calcium: 9.3 mg/dL (ref 8.7–10.3)
Chloride: 107 mmol/L — ABNORMAL HIGH (ref 96–106)
Creatinine, Ser: 0.91 mg/dL (ref 0.57–1.00)
GFR calc Af Amer: 79 mL/min/{1.73_m2} (ref >59)
GFR calc non Af Amer: 69 mL/min/{1.73_m2} (ref >59)
GLOBULIN, TOTAL: 2.9 g/dL (ref 1.5–4.5)
Glucose: 92 mg/dL (ref 65–99)
POTASSIUM: 4.3 mmol/L (ref 3.5–5.2)
SODIUM: 144 mmol/L (ref 134–144)
Total Protein: 7.1 g/dL (ref 6.0–8.5)

## 2017-07-24 LAB — LIPID PANEL
CHOL/HDL RATIO: 2.4 ratio (ref 0.0–4.4)
CHOLESTEROL TOTAL: 178 mg/dL (ref 100–199)
HDL: 73 mg/dL (ref >39)
LDL Calculated: 95 mg/dL (ref 0–99)
TRIGLYCERIDES: 49 mg/dL (ref 0–149)
VLDL Cholesterol Cal: 10 mg/dL (ref 5–40)

## 2017-07-24 LAB — TESTOSTERONE, FREE, DIRECT
TESTOSTERONE, TOTAL: 40.6 ng/dL — AB (ref 7.0–40.0)
Testosterone, Free: 0.7 pg/mL (ref 0.0–4.2)

## 2017-07-24 LAB — CBC
HEMATOCRIT: 42.8 % (ref 34.0–46.6)
Hemoglobin: 13.3 g/dL (ref 11.1–15.9)
MCH: 28.2 pg (ref 26.6–33.0)
MCHC: 31.1 g/dL — AB (ref 31.5–35.7)
MCV: 91 fL (ref 79–97)
Platelets: 201 10*3/uL (ref 150–379)
RBC: 4.72 x10E6/uL (ref 3.77–5.28)
RDW: 14.6 % (ref 12.3–15.4)
WBC: 3.9 10*3/uL (ref 3.4–10.8)

## 2017-07-24 LAB — VITAMIN B12: Vitamin B-12: 715 pg/mL (ref 232–1245)

## 2017-07-24 LAB — VITAMIN D 25 HYDROXY (VIT D DEFICIENCY, FRACTURES): Vit D, 25-Hydroxy: 31.1 ng/mL (ref 30.0–100.0)

## 2017-07-24 LAB — MAGNESIUM: Magnesium: 2.8 mg/dL — ABNORMAL HIGH (ref 1.6–2.3)

## 2017-07-24 LAB — TSH: TSH: 5.66 u[IU]/mL — ABNORMAL HIGH (ref 0.450–4.500)

## 2017-07-25 ENCOUNTER — Encounter: Payer: Self-pay | Admitting: Obstetrics and Gynecology

## 2017-07-25 ENCOUNTER — Other Ambulatory Visit: Payer: Self-pay | Admitting: Obstetrics and Gynecology

## 2017-07-25 DIAGNOSIS — R7989 Other specified abnormal findings of blood chemistry: Secondary | ICD-10-CM

## 2017-07-27 LAB — SPECIMEN STATUS REPORT

## 2017-07-27 LAB — T3, FREE: T3 FREE: 2.3 pg/mL (ref 2.0–4.4)

## 2017-07-27 LAB — T4, FREE: Free T4: 1.1 ng/dL (ref 0.82–1.77)

## 2017-08-24 ENCOUNTER — Telehealth: Payer: Self-pay | Admitting: Obstetrics and Gynecology

## 2017-08-24 NOTE — Telephone Encounter (Signed)
Please remind patient she is due to have a lab visit for a magnesium recheck. She came up in my Epic reminder box as an overdue lab.

## 2017-08-26 NOTE — Telephone Encounter (Signed)
Attempted to reach patient at number provided [phone], there was no answer and recording states that the voicemail box is full.

## 2017-08-26 NOTE — Telephone Encounter (Signed)
Spoke with patient. Lab appointment scheduled for 08/30/2017 at 8:40 am. Patient is agreeable to date and time.  Routing to provider for final review. Patient agreeable to disposition. Will close encounter.

## 2017-08-30 ENCOUNTER — Other Ambulatory Visit (INDEPENDENT_AMBULATORY_CARE_PROVIDER_SITE_OTHER): Payer: 59

## 2017-08-31 LAB — MAGNESIUM: MAGNESIUM: 2.3 mg/dL (ref 1.6–2.3)

## 2017-10-02 IMAGING — CR DG CHEST 1V
1 series · 1 of 1 positions shown · non-contrast
Comparison: None.

CLINICAL DATA: Physical examination.  Positive TB test.

EXAM:
CHEST 1 VIEW

[view not recorded]
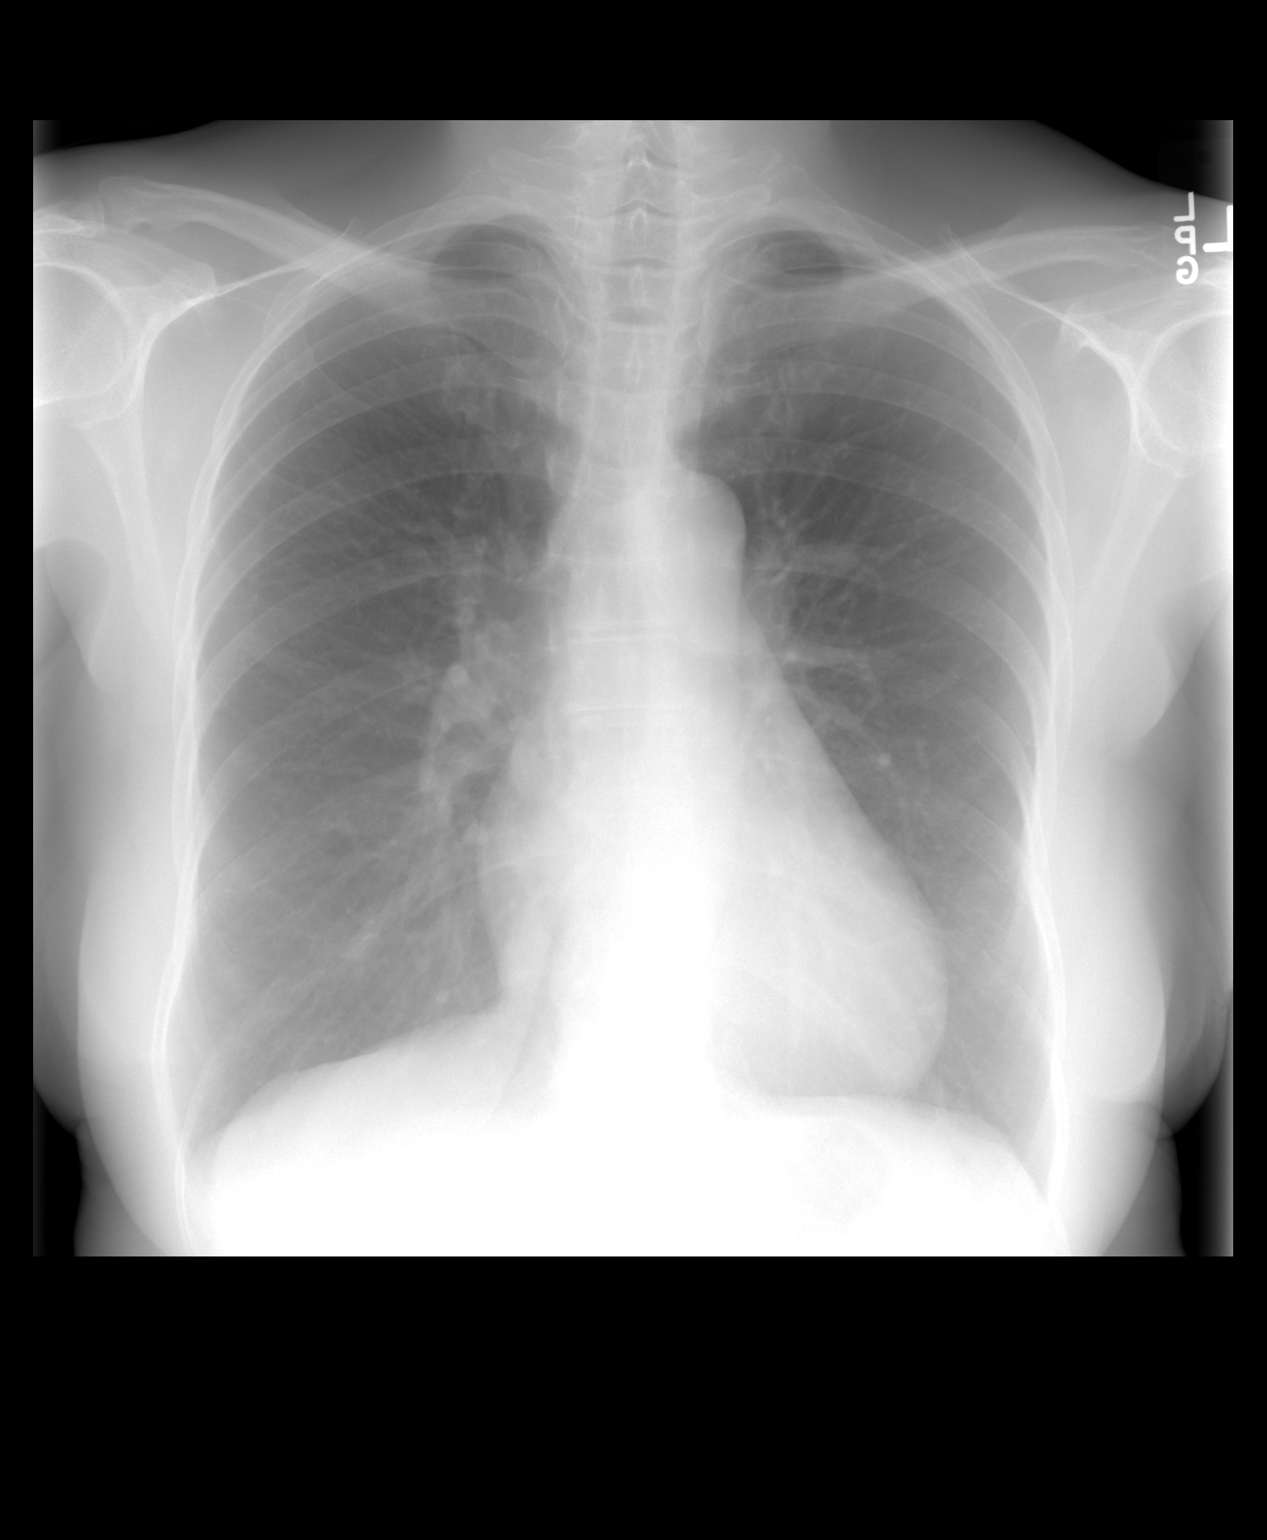

[1 of 1 positions shown; findings below may reference images not displayed]

FINDINGS: Lungs are clear. Heart size is normal. No pneumothorax or pleural
effusion. No bony abnormality.
IMPRESSION: Negative chest.

## 2017-10-09 DIAGNOSIS — R7989 Other specified abnormal findings of blood chemistry: Secondary | ICD-10-CM

## 2017-10-09 HISTORY — DX: Other specified abnormal findings of blood chemistry: R79.89

## 2017-11-01 ENCOUNTER — Other Ambulatory Visit (INDEPENDENT_AMBULATORY_CARE_PROVIDER_SITE_OTHER): Payer: 59

## 2017-11-01 DIAGNOSIS — R7989 Other specified abnormal findings of blood chemistry: Secondary | ICD-10-CM

## 2017-11-05 LAB — TESTOSTERONE, FREE, DIRECT
TESTOSTERONE, TOTAL: 40.8 ng/dL — AB (ref 7.0–40.0)
Testosterone, Free: 1.8 pg/mL (ref 0.0–4.2)

## 2017-11-05 LAB — THYROID PANEL WITH TSH
FREE THYROXINE INDEX: 1.7 (ref 1.2–4.9)
T3 UPTAKE RATIO: 25 % (ref 24–39)
T4, Total: 6.9 ug/dL (ref 4.5–12.0)
TSH: 5.52 u[IU]/mL — ABNORMAL HIGH (ref 0.450–4.500)

## 2017-11-07 ENCOUNTER — Encounter: Payer: Self-pay | Admitting: Obstetrics and Gynecology

## 2018-05-12 ENCOUNTER — Other Ambulatory Visit: Payer: Self-pay | Admitting: Obstetrics and Gynecology

## 2018-05-12 DIAGNOSIS — Z1231 Encounter for screening mammogram for malignant neoplasm of breast: Secondary | ICD-10-CM

## 2018-06-09 ENCOUNTER — Ambulatory Visit
Admission: RE | Admit: 2018-06-09 | Discharge: 2018-06-09 | Disposition: A | Discharge status: Home / Self Care | Payer: 59 | Source: Ambulatory Visit | Attending: Obstetrics and Gynecology | Admitting: Obstetrics and Gynecology

## 2018-06-09 DIAGNOSIS — Z1231 Encounter for screening mammogram for malignant neoplasm of breast: Secondary | ICD-10-CM

## 2018-07-24 NOTE — Progress Notes (Signed)
63 y.o. [id number] Married [place] American female here for annual exam.    Patient is complaining of burning with urination. Symptoms for 2 weeks.  Notes that her food choices affect her urinary symptoms.  Acidic food affect her.  Feels that menopause is affecting her.  Denies dysuria but feels burning in the vagina.  The vaginal symptoms are really bothering her.   No fever, nausea or vomiting.  Some back pain with sitting for a long time.   Asking for comprehensive labs and a pap.   Urine dip:  Trace RBCs.   PCP: London Pepper, MD [facility]  Patient's last menstrual period was 11/10/2011.           Sexually active: Yes.   female The current method of family planning is post menopausal status.    Exercising: Yes.    walking, treadmill, weights Smoker:  no  Health Maintenance: Pap: 07-19-17 Neg:Neg HR HPV, 07-06-16 Neg:Neg HR HPV History of abnormal Pap:  Yes, 06-15-15 LGSIL:Neg HR HPV MMG: 06-09-18 3D Neg/density B/BiRads1 Colonoscopy: Cologuard 2018 normal per patient BMD: n/a  Result  n/a TDaP:  11-17-16 Gardasil:   no HIV: 07-06-16 NR Hep C: 07-06-16 Neg Screening Labs: ---   reports that she has never smoked. She has never used smokeless tobacco. She reports that she does not drink alcohol or use drugs.  Past Medical History:  Diagnosis Date  . Elevated testosterone level in female 10/2017   normal free testosterone  . Elevated TSH 07/19/2017   Normal free T3 and free T4  . Environmental allergies   . Fibroids   . History of postpartum depression 1987  . Hypermagnesemia 2019    Past Surgical History:  Procedure Laterality Date  . CESAREAN SECTION      Current Outpatient Medications  Medication Sig Dispense Refill  . loratadine (CLARITIN) 10 MG tablet Take 1 tablet (10 mg total) by mouth daily as needed. 30 tablet 11  . cholecalciferol (VITAMIN D) 1000 units tablet Take 1,000 Units by mouth daily.     No current facility-administered medications for this visit.      Family History  Problem Relation Age of Onset  . Heart attack Father 55       deceased  . Diabetes Brother 22       IDDM  . Hypertension Sister     Review of Systems  Genitourinary: Positive for dysuria.  All other systems reviewed and are negative.   Exam:   BP 106/60 (BP Location: Right Arm, Patient Position: Sitting, Cuff Size: Large)   Pulse 70   Resp (!) 22   Ht 5' 1.5" (1.562 m)   Wt 176 lb (79.8 kg)   LMP 11/10/2011   BMI 32.72 kg/m     General appearance: alert, cooperative and appears stated age Head: Normocephalic, without obvious abnormality, atraumatic Neck: no adenopathy, supple, symmetrical, trachea midline and thyroid normal to inspection and palpation Lungs: clear to auscultation bilaterally Breasts: normal appearance, no masses or tenderness, No nipple retraction or dimpling, No nipple discharge or bleeding, No axillary or supraclavicular adenopathy Heart: regular rate and rhythm Abdomen: soft, non-tender; no masses, no organomegaly Extremities: extremities normal, atraumatic, no cyanosis or edema Skin: Skin color, texture, turgor normal. No rashes or lesions Lymph nodes: Cervical, supraclavicular, and axillary nodes normal. No abnormal inguinal nodes palpated Neurologic: Grossly normal  Pelvic: External genitalia:  no lesions              Urethra:  normal appearing urethra  with no masses, tenderness or lesions              Bartholins and Skenes: normal                 Vagina: normal appearing vagina with normal color and discharge, no lesions              Cervix: no lesions              Pap taken: Yes.   Bimanual Exam:  Uterus:  normal size, small 1 cm fibroids noted, position, consistency, mobility, non-tender              Adnexa: no mass, fullness, tenderness              Rectal exam: Yes.  .  Confirms.              Anus:  normal sphincter tone, no lesions  Chaperone was present for exam.  Assessment:   Well woman visit with normal  exam. Food intolerances.  Vaginal burning.  Hx LGSIL.  Hx fibroids.   Plan: Mammogram screening. Recommended self breast awareness. Pap and HR HPV as above. Guidelines for Calcium, Vitamin D, regular exercise program including cardiovascular and weight bearing exercise. Vaginitis testing from pap.  She will start Premarin vaginal cream.  Instructed in use.  Discussed potential effect on breast cancer.  Comprehensive labs.  She will follow up with her GI regarding her digestive issues.  Follow up annually and prn.   After visit summary provided.

## 2018-07-25 ENCOUNTER — Other Ambulatory Visit (HOSPITAL_COMMUNITY)
Admission: RE | Admit: 2018-07-25 | Discharge: 2018-07-25 | Disposition: A | Discharge status: Home / Self Care | Payer: 59 | Source: Ambulatory Visit | Attending: Obstetrics and Gynecology | Admitting: Obstetrics and Gynecology

## 2018-07-25 ENCOUNTER — Encounter: Payer: Self-pay | Admitting: Obstetrics and Gynecology

## 2018-07-25 ENCOUNTER — Ambulatory Visit (INDEPENDENT_AMBULATORY_CARE_PROVIDER_SITE_OTHER): Payer: 59 | Admitting: Obstetrics and Gynecology

## 2018-07-25 ENCOUNTER — Other Ambulatory Visit: Payer: Self-pay

## 2018-07-25 VITALS — BP 106/60 | HR 70 | Resp 22 | Ht 61.5 in | Wt 176.0 lb

## 2018-07-25 DIAGNOSIS — R300 Dysuria: Secondary | ICD-10-CM

## 2018-07-25 DIAGNOSIS — Z01419 Encounter for gynecological examination (general) (routine) without abnormal findings: Secondary | ICD-10-CM

## 2018-07-25 DIAGNOSIS — N760 Acute vaginitis: Secondary | ICD-10-CM | POA: Insufficient documentation

## 2018-07-25 LAB — POCT URINALYSIS DIPSTICK
Bilirubin, UA: NEGATIVE
Glucose, UA: NEGATIVE
Ketones, UA: NEGATIVE
LEUKOCYTES UA: NEGATIVE
NITRITE UA: NEGATIVE
PH UA: 5 (ref 5.0–8.0)
PROTEIN UA: NEGATIVE
UROBILINOGEN UA: 0.2 U/dL

## 2018-07-25 MED ORDER — ESTROGENS, CONJUGATED 0.625 MG/GM VA CREA: TOPICAL_CREAM | VAGINAL | 2 refills | Status: DC

## 2018-07-25 NOTE — Patient Instructions (Signed)

## 2018-07-26 LAB — COMPREHENSIVE METABOLIC PANEL
ALBUMIN: 4.1 g/dL (ref 3.8–4.8)
ALK PHOS: 66 IU/L (ref 39–117)
ALT: 10 IU/L (ref 0–32)
AST: 20 IU/L (ref 0–40)
Albumin/Globulin Ratio: 1.5 (ref 1.2–2.2)
BUN / CREAT RATIO: 15 (ref 12–28)
BUN: 12 mg/dL (ref 8–27)
Bilirubin Total: 0.3 mg/dL (ref 0.0–1.2)
CALCIUM: 9.3 mg/dL (ref 8.7–10.3)
CO2: 25 mmol/L (ref 20–29)
CREATININE: 0.8 mg/dL (ref 0.57–1.00)
Chloride: 105 mmol/L (ref 96–106)
GFR calc Af Amer: 92 mL/min/{1.73_m2} (ref >59)
GFR calc non Af Amer: 80 mL/min/{1.73_m2} (ref >59)
GLUCOSE: 82 mg/dL (ref 65–99)
Globulin, Total: 2.8 g/dL (ref 1.5–4.5)
Potassium: 4.3 mmol/L (ref 3.5–5.2)
Sodium: 144 mmol/L (ref 134–144)
Total Protein: 6.9 g/dL (ref 6.0–8.5)

## 2018-07-26 LAB — CBC
HEMATOCRIT: 43.1 % (ref 34.0–46.6)
Hemoglobin: 13.5 g/dL (ref 11.1–15.9)
MCH: 28 pg (ref 26.6–33.0)
MCHC: 31.3 g/dL — ABNORMAL LOW (ref 31.5–35.7)
MCV: 89 fL (ref 79–97)
PLATELETS: 177 10*3/uL (ref 150–450)
RBC: 4.83 x10E6/uL (ref 3.77–5.28)
RDW: 14 % (ref 11.7–15.4)
WBC: 4 10*3/uL (ref 3.4–10.8)

## 2018-07-26 LAB — LIPID PANEL
Chol/HDL Ratio: 2.8 ratio (ref 0.0–4.4)
Cholesterol, Total: 205 mg/dL — ABNORMAL HIGH (ref 100–199)
HDL: 72 mg/dL (ref >39)
LDL Calculated: 121 mg/dL — ABNORMAL HIGH (ref 0–99)
Triglycerides: 58 mg/dL (ref 0–149)
VLDL CHOLESTEROL CAL: 12 mg/dL (ref 5–40)

## 2018-07-26 LAB — HEMOGLOBIN A1C
ESTIMATED AVERAGE GLUCOSE: 117 mg/dL
Hgb A1c MFr Bld: 5.7 % — ABNORMAL HIGH (ref 4.8–5.6)

## 2018-07-26 LAB — TSH: TSH: 3.99 u[IU]/mL (ref 0.450–4.500)

## 2018-07-26 LAB — VITAMIN D 25 HYDROXY (VIT D DEFICIENCY, FRACTURES): Vit D, 25-Hydroxy: 28.9 ng/mL — ABNORMAL LOW (ref 30.0–100.0)

## 2018-07-29 ENCOUNTER — Encounter: Payer: Self-pay | Admitting: Obstetrics and Gynecology

## 2018-07-29 LAB — CYTOLOGY - PAP
Bacterial vaginitis: NEGATIVE
CANDIDA VAGINITIS: NEGATIVE
Diagnosis: NEGATIVE
HPV (WINDOPATH): NOT DETECTED
Trichomonas: NEGATIVE

## 2018-08-28 ENCOUNTER — Other Ambulatory Visit: Payer: Self-pay

## 2018-08-28 ENCOUNTER — Encounter (INDEPENDENT_AMBULATORY_CARE_PROVIDER_SITE_OTHER): Payer: 59

## 2018-09-08 ENCOUNTER — Ambulatory Visit (INDEPENDENT_AMBULATORY_CARE_PROVIDER_SITE_OTHER): Payer: 59 | Admitting: Bariatrics

## 2018-09-23 ENCOUNTER — Ambulatory Visit (INDEPENDENT_AMBULATORY_CARE_PROVIDER_SITE_OTHER): Payer: 59 | Admitting: Bariatrics

## 2018-11-03 ENCOUNTER — Encounter (INDEPENDENT_AMBULATORY_CARE_PROVIDER_SITE_OTHER): Payer: Self-pay

## 2018-12-04 ENCOUNTER — Other Ambulatory Visit: Payer: Self-pay | Admitting: Gastroenterology

## 2018-12-04 DIAGNOSIS — R1010 Upper abdominal pain, unspecified: Secondary | ICD-10-CM

## 2018-12-16 ENCOUNTER — Ambulatory Visit
Admission: RE | Admit: 2018-12-16 | Discharge: 2018-12-16 | Disposition: A | Discharge status: Home / Self Care | Payer: 59 | Source: Ambulatory Visit | Attending: Gastroenterology | Admitting: Gastroenterology

## 2018-12-16 DIAGNOSIS — R1010 Upper abdominal pain, unspecified: Secondary | ICD-10-CM

## 2018-12-29 ENCOUNTER — Other Ambulatory Visit: Payer: Self-pay | Admitting: Surgery

## 2019-01-01 ENCOUNTER — Other Ambulatory Visit: Payer: Self-pay | Admitting: Surgery

## 2019-01-01 DIAGNOSIS — K8020 Calculus of gallbladder without cholecystitis without obstruction: Secondary | ICD-10-CM

## 2019-01-12 ENCOUNTER — Ambulatory Visit
Admission: RE | Admit: 2019-01-12 | Discharge: 2019-01-12 | Disposition: A | Discharge status: Home / Self Care | Payer: 59 | Source: Ambulatory Visit | Attending: Surgery | Admitting: Surgery

## 2019-01-12 DIAGNOSIS — K8020 Calculus of gallbladder without cholecystitis without obstruction: Secondary | ICD-10-CM

## 2019-01-12 MED ORDER — IOPAMIDOL (ISOVUE-300) INJECTION 61%
100.0000 mL | Freq: Once | INTRAVENOUS | Status: AC | PRN
  Administered 2019-01-12: 100 mL via INTRAVENOUS

## 2019-01-14 ENCOUNTER — Telehealth: Payer: Self-pay | Admitting: Obstetrics and Gynecology

## 2019-01-14 NOTE — Telephone Encounter (Signed)
Call to patient. Patient states that she has seen her [facility] for bloating, fullness and RLQ pain. Patient states they did an [place] that showed gallstones, but the gallstones did not explain her symptoms. Was referred for a CT scan which showed fibroids. Patient states she is still having the dull, aching pressure and would like to discuss with Dr. Quincy Simmonds. States the pain is always there and she takes tylenol and applies heat as needed. OV scheduled for 01-21-2019 at 1400 with Dr. Quincy Simmonds. Patient agreeable to date and time of appointment.   Routing to provider and will close encounter.

## 2019-01-14 NOTE — Telephone Encounter (Signed)
Patient is having issues with fibroids.

## 2019-01-19 ENCOUNTER — Other Ambulatory Visit: Payer: Self-pay | Admitting: Surgery

## 2019-01-19 ENCOUNTER — Other Ambulatory Visit: Payer: Self-pay

## 2019-01-20 NOTE — Progress Notes (Signed)
GYNECOLOGY  VISIT   HPI: 63 y.o.   Married  [place] American  female   [phone] with Patient's last menstrual period was 11/10/2011.   here for evaluation of pelvic pain and lower back pain. Had PUS and CT scan with GI which revealed gallstones and fibroids.  States she pain is a deep pain in the right lower quadrant wrapping around to her back.  She also had bloating. Decreased appetite. She saw a Education officer, environmental, show suggested it could also be a hernia.   CT abdomen and pelvis is documenting several fibroids, similar in appearance to prior study.  No adnexal lesions.   She also had a diastasis.  Normal appendix.  Prior abdominal US showing gallstones.   She states her food sensitivities are now better.   Some vaginal burning with urination.  No odor.  Eating acidic food makes for some pain with urination.  She had negative vaginitis testing in Feb. 2020.   No partner change.   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   GYNECOLOGIC HISTORY: Patient's last menstrual period was 11/10/2011. Contraception: Postmenopausal Menopausal hormone therapy: Premarin cream Last mammogram: 06-09-18 3D Neg/density b/BiRads1 Last pap smear: 07-25-18 Neg:Neg HR HPV                             07-19-17 Neg:Neg HR HPV OB History    Gravida  4   Para  3   Term  3   Preterm      AB  1   Living  3     SAB  1   TAB      Ectopic      Multiple      Live Births                 There are no active problems to display for this patient.   Past Medical History:  Diagnosis Date  . Elevated testosterone level in female 10/2017   normal free testosterone  . Elevated TSH 07/19/2017   Normal free T3 and free T4  . Environmental allergies   . Fibroids   . History of postpartum depression 1987  . Hypermagnesemia 2019    Past Surgical History:  Procedure Laterality Date  . CESAREAN SECTION      Current Outpatient Medications  Medication Sig Dispense Refill  .  cholecalciferol (VITAMIN D) 1000 units tablet Take 1,000 Units by mouth daily.    . famotidine (PEPCID) 20 MG tablet Take 20 mg by mouth as needed for heartburn or indigestion.    . conjugated estrogens (PREMARIN) vaginal cream Use 1/2 g vaginally every night at bed time for the first 2 weeks, then use 1/2 g vaginally two or three times per week. (Patient not taking: Reported on 01/21/2019) 30 g 2   No current facility-administered medications for this visit.      ALLERGIES: Patient has no known allergies.  Family History  Problem Relation Age of Onset  . Heart attack Father 20       deceased  . Diabetes Brother 22       IDDM  . Hypertension Sister     Social History   Socioeconomic History  . Marital status: Married    Spouse name: Not on file  . Number of children: Not on file  . Years of education: Not on file  . Highest education level: Not on file  Occupational History  . Not on file  Social Needs  . Financial resource strain: Not on file  . Food insecurity    Worry: Not on file    Inability: Not on file  . Transportation needs    Medical: Not on file    Non-medical: Not on file  Tobacco Use  . Smoking status: Never Smoker  . Smokeless tobacco: Never Used  Substance and Sexual Activity  . Alcohol use: No    Alcohol/week: 0.0 standard drinks  . Drug use: No  . Sexual activity: Yes    Partners: Male    Birth control/protection: Other-see comments, Post-menopausal    Comment: vasectomy  Lifestyle  . Physical activity    Days per week: Not on file    Minutes per session: Not on file  . Stress: Not on file  Relationships  . Social Herbalist on phone: Not on file    Gets together: Not on file    Attends religious service: Not on file    Active member of club or organization: Not on file    Attends meetings of clubs or organizations: Not on file    Relationship status: Not on file  . Intimate partner violence    Fear of current or ex partner: Not  on file    Emotionally abused: Not on file    Physically abused: Not on file    Forced sexual activity: Not on file  Other Topics Concern  . Not on file  Social History Narrative  . Not on file    Review of Systems  All other systems reviewed and are negative.   PHYSICAL EXAMINATION:    BP 110/74 (Cuff Size: Large)   Pulse 66   Temp (!) 97.3 F (36.3 C) (Temporal)   Resp 20   Ht 5' 1.5" (1.562 m)   Wt 182 lb (82.6 kg)   LMP 11/10/2011   BMI 33.83 kg/m     General appearance: alert, cooperative and appears stated age  Pelvic: External genitalia:  no lesions              Urethra:  normal appearing urethra with no masses, tenderness or lesions              Bartholins and Skenes: normal                 Vagina: normal appearing vagina with normal color and discharge, no lesions              Cervix: no lesions                Bimanual Exam:  Uterus:  normal size, contour, position, consistency, mobility, non-tender              Adnexa: no mass, fullness, tenderness              Rectal exam: Yes.  .  Confirms.              Anus:  normal sphincter tone, no lesions  Chaperone was present for exam.  ASSESSMENT  RLQ pain.  Bloating. Fibroids.  Cholelithiasis.  Diastasis rectii.  Dysuria.   PLAN  Discussion of potential causes of pain.  We reviewed fibroids.  Return for pelvic US to evaluate fibroids and adnexal regions. Urine culture.   An After Visit Summary was printed and given to the patient.  _15____ minutes face to face time of which over 50% was spent in counseling.

## 2019-01-21 ENCOUNTER — Encounter: Payer: Self-pay | Admitting: Obstetrics and Gynecology

## 2019-01-21 ENCOUNTER — Ambulatory Visit (INDEPENDENT_AMBULATORY_CARE_PROVIDER_SITE_OTHER): Payer: 59 | Admitting: Obstetrics and Gynecology

## 2019-01-21 ENCOUNTER — Telehealth: Payer: Self-pay | Admitting: Obstetrics and Gynecology

## 2019-01-21 ENCOUNTER — Other Ambulatory Visit: Payer: Self-pay

## 2019-01-21 VITALS — BP 110/74 | HR 66 | Temp 97.3°F | Resp 20 | Ht 61.5 in | Wt 182.0 lb

## 2019-01-21 DIAGNOSIS — R1031 Right lower quadrant pain: Secondary | ICD-10-CM | POA: Diagnosis not present

## 2019-01-21 DIAGNOSIS — D219 Benign neoplasm of connective and other soft tissue, unspecified: Secondary | ICD-10-CM | POA: Diagnosis not present

## 2019-01-21 DIAGNOSIS — R300 Dysuria: Secondary | ICD-10-CM

## 2019-01-21 NOTE — Patient Instructions (Signed)

## 2019-01-21 NOTE — Telephone Encounter (Signed)
Call placed to patient to schedule recommended ultrasound. Unable to leave a message as the voicemail is full

## 2019-01-22 NOTE — Telephone Encounter (Signed)
Patient returned call. Reviewed benefit for recommended ultrasound. Patient acknowledges understanding of information presented. Patient is ready to proceed with scheduling. Patient is scheduled 01/27/2019 with Dr Quincy Simmonds. Patient is aware of the appointment date, arrival time and cancellation policy. No further questions. Will close encounter

## 2019-01-23 ENCOUNTER — Other Ambulatory Visit: Payer: Self-pay

## 2019-01-23 LAB — URINE CULTURE

## 2019-01-27 ENCOUNTER — Other Ambulatory Visit: Payer: Self-pay

## 2019-01-27 ENCOUNTER — Ambulatory Visit (INDEPENDENT_AMBULATORY_CARE_PROVIDER_SITE_OTHER): Payer: 59 | Admitting: Obstetrics and Gynecology

## 2019-01-27 ENCOUNTER — Encounter: Payer: Self-pay | Admitting: Obstetrics and Gynecology

## 2019-01-27 ENCOUNTER — Ambulatory Visit (INDEPENDENT_AMBULATORY_CARE_PROVIDER_SITE_OTHER): Payer: 59

## 2019-01-27 VITALS — BP 126/80 | HR 60 | Temp 97.1°F | Ht 61.5 in | Wt 184.2 lb

## 2019-01-27 DIAGNOSIS — N838 Other noninflammatory disorders of ovary, fallopian tube and broad ligament: Secondary | ICD-10-CM

## 2019-01-27 DIAGNOSIS — R1031 Right lower quadrant pain: Secondary | ICD-10-CM | POA: Insufficient documentation

## 2019-01-27 DIAGNOSIS — D219 Benign neoplasm of connective and other soft tissue, unspecified: Secondary | ICD-10-CM | POA: Insufficient documentation

## 2019-01-27 NOTE — Progress Notes (Signed)
GYNECOLOGY  VISIT   HPI: 63 y.o.   Married  [place]  female   [phone] with Patient's last menstrual period was 11/10/2011.   here for pelvic ultrasound for RLQ pain and known fibroids.  She also has bloating.   CT showed fibroids in the uterus.  Prior US showed gallstones.   GYNECOLOGIC HISTORY: Patient's last menstrual period was 11/10/2011. Contraception: Postmenopausal Menopausal hormone therapy:  Premarin cream Last mammogram: 06-09-18 3D Neg/density b/BiRads1 Last pap smear:  07-25-18 Neg:Neg HR HPV                             07-19-17 Neg:Neg HR HPV        OB History    Gravida  4   Para  3   Term  3   Preterm      AB  1   Living  3     SAB  1   TAB      Ectopic      Multiple      Live Births                 There are no active problems to display for this patient.   Past Medical History:  Diagnosis Date  . Elevated testosterone level in female 10/2017   normal free testosterone  . Elevated TSH 07/19/2017   Normal free T3 and free T4  . Environmental allergies   . Fibroids   . History of postpartum depression 1987  . Hypermagnesemia 2019    Past Surgical History:  Procedure Laterality Date  . CESAREAN SECTION      Current Outpatient Medications  Medication Sig Dispense Refill  . cholecalciferol (VITAMIN D) 1000 units tablet Take 1,000 Units by mouth daily.    Marland Kitchen conjugated estrogens (PREMARIN) vaginal cream Use 1/2 g vaginally every night at bed time for the first 2 weeks, then use 1/2 g vaginally two or three times per week. 30 g 2  . famotidine (PEPCID) 20 MG tablet Take 20 mg by mouth as needed for heartburn or indigestion.     No current facility-administered medications for this visit.      ALLERGIES: Patient has no known allergies.  Family History  Problem Relation Age of Onset  . Heart attack Father 61       deceased  . Diabetes Brother 22       IDDM  . Hypertension Sister     Social History   Socioeconomic  History  . Marital status: Married    Spouse name: Not on file  . Number of children: Not on file  . Years of education: Not on file  . Highest education level: Not on file  Occupational History  . Not on file  Social Needs  . Financial resource strain: Not on file  . Food insecurity    Worry: Not on file    Inability: Not on file  . Transportation needs    Medical: Not on file    Non-medical: Not on file  Tobacco Use  . Smoking status: Never Smoker  . Smokeless tobacco: Never Used  Substance and Sexual Activity  . Alcohol use: No    Alcohol/week: 0.0 standard drinks  . Drug use: No  . Sexual activity: Yes    Partners: Male    Birth control/protection: Other-see comments, Post-menopausal    Comment: vasectomy  Lifestyle  . Physical activity    Days per  week: Not on file    Minutes per session: Not on file  . Stress: Not on file  Relationships  . Social Herbalist on phone: Not on file    Gets together: Not on file    Attends religious service: Not on file    Active member of club or organization: Not on file    Attends meetings of clubs or organizations: Not on file    Relationship status: Not on file  . Intimate partner violence    Fear of current or ex partner: Not on file    Emotionally abused: Not on file    Physically abused: Not on file    Forced sexual activity: Not on file  Other Topics Concern  . Not on file  Social History Narrative  . Not on file    Review of Systems  All other systems reviewed and are negative.   PHYSICAL EXAMINATION:    BP 126/80   Pulse 60   Temp (!) 97.1 F (36.2 C) (Temporal)   Ht 5' 1.5" (1.562 m)   Wt 184 lb 3.2 oz (83.6 kg)   LMP 11/10/2011   BMI 34.24 kg/m     General appearance: alert, cooperative and appears stated age   Pelvic US  Uterus with 2 fibroids - 0.73 and 2.79 mm. Largest fibroid is fundal and encroaches on the endometrium.  EMS 2.53 mm.  Right ovary normal.  Left ovary with multiple  calcifications, largest 4 mm.  No free fluid.  ASSESSMENT  RLQ pain.  Cholelithiasis.  Uterine fibroids.  Left ovary calcifications.   PLAN  We discussed her pelvic US findings, which are not likely causes for her RLQ pain.  I recommend she return to her general surgeon for further evaluation and treatment of her pain.  FU for annual exam and prn.    An After Visit Summary was printed and given to the patient.  _15_____ minutes face to face time of which over 50% was spent in counseling.

## 2019-02-03 ENCOUNTER — Other Ambulatory Visit: Payer: Self-pay | Admitting: Surgery

## 2019-02-24 NOTE — Pre-Procedure Instructions (Signed)
Amillya Nembhard  02/24/2019      [facility] [address], [place] [id number] [place] [address] Phone: [phone] Fax: [phone]    Your procedure is scheduled on March 02, 2019.  Report to [facility], Entrance "A" at 700 AM.  Call this number if you have problems the morning of surgery:  [phone]   Remember:  Do not eat  after midnight.  You may drink clear liquids until 600 AM.  Clear liquids allowed are: Water, Juice (non-citric and without pulp), Clear Tea, Black Coffee only and Gatorade    Take these medicines the morning of surgery with A SIP OF WATER  Famotidine (Pepcid)-if needed    Day of surgery:  Do not wear jewelry, make-up or nail polish.  Do not wear lotions, powders, or perfumes, or deodorant.  Do not shave 48 hours prior to surgery.    Do not bring valuables to the hospital.  [facility] is not responsible for any belongings or valuables.  Contacts, dentures or bridgework may not be worn into surgery.  Leave your suitcase in the car.  After surgery it may be brought to your room.  For patients admitted to the hospital, discharge time will be determined by your treatment team.  Patients discharged the day of surgery will not be allowed to drive home.    [facility]- Preparing For Surgery  Before surgery, you can play an important role. Because skin is not sterile, your skin needs to be as free of germs as possible. You can reduce the number of germs on your skin by washing with CHG (chlorahexidine gluconate) Soap before surgery.  CHG is an antiseptic cleaner which kills germs and bonds with the skin to continue killing germs even after washing.    Oral Hygiene is also important to reduce your risk of infection.  Remember - BRUSH YOUR TEETH THE MORNING OF SURGERY WITH YOUR REGULAR TOOTHPASTE  Please do not use if you have an allergy to CHG or antibacterial soaps. If your skin becomes  reddened/irritated stop using the CHG.  Do not shave (including legs and underarms) for at least 48 hours prior to first CHG shower. It is OK to shave your face.  Please follow these instructions carefully.   1. Shower the NIGHT BEFORE SURGERY and the MORNING OF SURGERY with CHG.   2. If you chose to wash your hair, wash your hair first as usual with your normal shampoo.  3. After you shampoo, rinse your hair and body thoroughly to remove the shampoo.  4. Use CHG as you would any other liquid soap. You can apply CHG directly to the skin and wash gently with a scrungie or a clean washcloth.   5. Apply the CHG Soap to your body ONLY FROM THE NECK DOWN.  Do not use on open wounds or open sores. Avoid contact with your eyes, ears, mouth and genitals (private parts). Wash Face and genitals (private parts)  with your normal soap.  6. Wash thoroughly, paying special attention to the area where your surgery will be performed.  7. Thoroughly rinse your body with warm water from the neck down.  8. DO NOT shower/wash with your normal soap after using and rinsing off the CHG Soap.  9. Pat yourself dry with a CLEAN TOWEL.  10. Wear CLEAN PAJAMAS to bed the night before surgery, wear comfortable clothes the morning of surgery  11. Place CLEAN SHEETS on your bed the  night of your first shower and DO NOT SLEEP WITH PETS.   Day of Surgery: Shower as above Do not apply any deodorants/lotions.  Please wear clean clothes to the hospital/surgery center.   Remember to brush your teeth WITH YOUR REGULAR TOOTHPASTE.  Please read over the following fact sheets that you were given.

## 2019-02-24 NOTE — Progress Notes (Addendum)
PCP - London Pepper, MD Cardiologist - pt denies  Chest x-ray - pt denies past year-no problem EKG - pt denies  Stress Test - pt denies ECHO - pt denies  Cardiac Cath - pt denies  Sleep Study - pt denies CPAP - n/a  Fasting Blood Sugar - n/a Checks Blood Sugar _____ times a day-n/a  Blood Thinner Instructions: n/a Aspirin Instructions: n/a  Anesthesia review:  No  Patient denies shortness of breath, fever, cough and chest pain at PAT appointment  Patient verbalized understanding of instructions that were given to them at the PAT appointment. Patient was also instructed that they will need to review over the PAT instructions again at home before surgery.   Coronavirus Screening  Have you experienced the following symptoms:  Cough yes/no: No Fever (>100.11F)  yes/no: No Runny nose yes/no: No Sore throat yes/no: No Difficulty breathing/shortness of breath  yes/no: No  Have you or a family member traveled in the last 14 days and where? yes/no: No   If the patient indicates "YES" to the above questions, their PAT will be rescheduled to limit the exposure to others and, the surgeon will be notified. THE PATIENT WILL NEED TO BE ASYMPTOMATIC FOR 14 DAYS.   If the patient is not experiencing any of these symptoms, the PAT nurse will instruct them to NOT bring anyone with them to their appointment since they may have these symptoms or traveled as well.   Please remind your patients and families that hospital visitation restrictions are in effect and the importance of the restrictions.

## 2019-02-25 ENCOUNTER — Encounter (HOSPITAL_COMMUNITY)
Admission: RE | Admit: 2019-02-25 | Discharge: 2019-02-25 | Disposition: A | Discharge status: Home / Self Care | Payer: 59 | Source: Ambulatory Visit | Attending: Surgery | Admitting: Surgery

## 2019-02-25 ENCOUNTER — Other Ambulatory Visit: Payer: Self-pay

## 2019-02-25 ENCOUNTER — Encounter (HOSPITAL_COMMUNITY): Payer: Self-pay

## 2019-02-25 DIAGNOSIS — Z01812 Encounter for preprocedural laboratory examination: Secondary | ICD-10-CM | POA: Diagnosis present

## 2019-02-25 DIAGNOSIS — K8020 Calculus of gallbladder without cholecystitis without obstruction: Secondary | ICD-10-CM | POA: Diagnosis not present

## 2019-02-25 LAB — CBC
HCT: 47.6 % — ABNORMAL HIGH (ref 36.0–46.0)
Hemoglobin: 14.7 g/dL (ref 12.0–15.0)
MCH: 28.9 pg (ref 26.0–34.0)
MCHC: 30.9 g/dL (ref 30.0–36.0)
MCV: 93.5 fL (ref 80.0–100.0)
Platelets: 148 10*3/uL — ABNORMAL LOW (ref 150–400)
RBC: 5.09 MIL/uL (ref 3.87–5.11)
RDW: 14.5 % (ref 11.5–15.5)
WBC: 4.7 10*3/uL (ref 4.0–10.5)
nRBC: 0 % (ref 0.0–0.2)

## 2019-02-26 ENCOUNTER — Other Ambulatory Visit (HOSPITAL_COMMUNITY): Payer: 59

## 2019-02-26 ENCOUNTER — Other Ambulatory Visit (HOSPITAL_COMMUNITY)
Admission: RE | Admit: 2019-02-26 | Discharge: 2019-02-26 | Disposition: A | Discharge status: Home / Self Care | Payer: 59 | Source: Ambulatory Visit | Attending: Surgery | Admitting: Surgery

## 2019-02-26 DIAGNOSIS — Z20828 Contact with and (suspected) exposure to other viral communicable diseases: Secondary | ICD-10-CM | POA: Diagnosis not present

## 2019-02-26 DIAGNOSIS — Z01812 Encounter for preprocedural laboratory examination: Secondary | ICD-10-CM | POA: Insufficient documentation

## 2019-02-26 DIAGNOSIS — K8020 Calculus of gallbladder without cholecystitis without obstruction: Secondary | ICD-10-CM | POA: Insufficient documentation

## 2019-02-27 LAB — NOVEL CORONAVIRUS, NAA (HOSP ORDER, SEND-OUT TO REF LAB; TAT 18-24 HRS): SARS-CoV-2, NAA: NOT DETECTED

## 2019-03-01 NOTE — H&P (Signed)
Tanya Weber Documented: 02/03/2019 2:49 PM Location: [facility] Patient #: [id number] DOB: 04-Mar-1956 Married / Language: English / Race: Black or African American Female   History of Present Illness (Garrell Flagg A. Ninfa Linden MD; 02/03/2019 3:06 PM) The patient is a 63 year old female who presents for a preoperative evaluation. She is here again for evaluation of her symptomatic gallstones. After I saw her, I referred her back to gynecology and she was also having pelvic pain and right lower quadrant pain. She saw her gynecologist who does not feel like the fibroids or a cause of her discomfort. The patient wants to go ahead and proceed with cholecystectomy. Again most for symptoms are in the upper abdomen consistent with symptomatic cholelithiasis.  Past Surgical History (Tanisha A. Owens Shark, [place]; 12/29/2018 1:55 PM) Cesarean Section - 1   Diagnostic Studies History (Tanisha A. Owens Shark, [place]; 12/29/2018 1:55 PM) Mammogram  within last year Pap Smear  1-5 years ago  Allergies (Tanisha A. Owens Shark, [place]; 12/29/2018 1:56 PM) No Known Drug Allergies  [12/29/2018]: Allergies Reconciled   Medication History (Tanisha A. Owens Shark, [place]; 12/29/2018 1:56 PM) Pepcid (20MG  Tablet, Oral) Active. Medications Reconciled  Social History (Tanisha A. Owens Shark, [place]; 12/29/2018 1:55 PM) Caffeine use  Tea. No alcohol use  No drug use  Tobacco use  Never smoker.  Family History (Tanisha A. Owens Shark, [place]; 12/29/2018 1:55 PM) Heart Disease  Father, Mother.  Pregnancy / Birth History (Tanisha A. Owens Shark, [place]; 12/29/2018 1:55 PM) Age at menarche  45 years. Age of menopause  [age] Gravida  4 Length (months) of breastfeeding  3-6 Maternal age  47-30 Para  3  Other Problems (Tanisha A. Owens Shark, [place]; 12/29/2018 1:55 PM) Anxiety Disorder  Back Pain  Gastric Ulcer     Review of Systems (Tanisha A. Brown RMA; 12/29/2018 1:55 PM) General Present- Weight Gain. Not Present- Appetite Loss, Chills,  Fatigue, Fever, Night Sweats and Weight Loss. Skin Not Present- Change in Wart/Mole, Dryness, Hives, Jaundice, New Lesions, Non-Healing Wounds, Rash and Ulcer. HEENT Present- Earache and Seasonal Allergies. Not Present- Hearing Loss, Hoarseness, Nose Bleed, Oral Ulcers, Ringing in the Ears, Sinus Pain, Sore Throat, Visual Disturbances, Wears glasses/contact lenses and Yellow Eyes. Gastrointestinal Present- Abdominal Pain, Bloating, Excessive gas, Gets full quickly at meals and Indigestion. Not Present- Bloody Stool, Change in Bowel Habits, Chronic diarrhea, Constipation, Difficulty Swallowing, Hemorrhoids, Nausea, Rectal Pain and Vomiting. Female Genitourinary Not Present- Frequency, Nocturia, Painful Urination, Pelvic Pain and Urgency. Musculoskeletal Present- Back Pain, Joint Pain and Muscle Pain. Not Present- Joint Stiffness, Muscle Weakness and Swelling of Extremities. Neurological Not Present- Decreased Memory, Fainting, Headaches, Numbness, Seizures, Tingling, Tremor, Trouble walking and Weakness. Psychiatric Present- Anxiety. Not Present- Bipolar, Change in Sleep Pattern, Depression, Fearful and Frequent crying. Endocrine Not Present- Cold Intolerance, Excessive Hunger, Hair Changes, Heat Intolerance, Hot flashes and New Diabetes. Hematology Not Present- Blood Thinners, Easy Bruising, Excessive bleeding, Gland problems, HIV and Persistent Infections. Medication History (Armen Glo Herring, CMA; 02/03/2019 2:51 PM) Pepcid (20MG  Tablet, Oral) Active. Medications Reconciled  Vitals (Armen Ferguson CMA; 02/03/2019 2:51 PM) 02/03/2019 2:50 PM Weight: 185 lb Height: 63in Body Surface Area: 1.87 m Body Mass Index: 32.77 kg/m  Temp.: 76F  Pulse: 84 (Regular)  P.OX: 99% (Room air) BP: 124/78(Sitting, Left Arm, Standard)       Physical Exam (Judge Duque A. Ninfa Linden MD; 02/03/2019 3:06 PM) The physical exam findings are as follows: Note: Today, she looks well. There is no tenderness in  her abdomen. Lungs clear CV RRR General in  NAD Skin without erythema    Assessment & Plan (Ilian Wessell A. Ninfa Linden MD; 02/03/2019 3:07 PM)  SYMPTOMATIC CHOLELITHIASIS (K80.20) Impression: At this point we again discussed cholecystectomy in detail. We again discussed the risks which include but are not limited to bleeding, infection, injury to surrounding structures, the need to convert to an open procedure, bile duct injury, bile leak, the chance this may not resolve her pain in the right lower quadrant or pelvis, cardiopulmonary issues, postoperative recovery, preoperative COVID testing etc. She understands and wishes to proceed with surgery

## 2019-03-02 ENCOUNTER — Ambulatory Visit (HOSPITAL_COMMUNITY): Payer: 59

## 2019-03-02 ENCOUNTER — Encounter (HOSPITAL_COMMUNITY)
Admission: RE | Disposition: A | Discharge status: Home / Self Care | Payer: Self-pay | Source: Home / Self Care | Attending: Surgery

## 2019-03-02 ENCOUNTER — Ambulatory Visit (HOSPITAL_COMMUNITY)
Admission: RE | Admit: 2019-03-02 | Discharge: 2019-03-02 | Disposition: A | Discharge status: Home / Self Care | Payer: 59 | Attending: Surgery | Admitting: Surgery

## 2019-03-02 ENCOUNTER — Encounter (HOSPITAL_COMMUNITY): Payer: Self-pay | Admitting: Certified Registered Nurse Anesthetist

## 2019-03-02 DIAGNOSIS — Z79899 Other long term (current) drug therapy: Secondary | ICD-10-CM | POA: Insufficient documentation

## 2019-03-02 DIAGNOSIS — K8010 Calculus of gallbladder with chronic cholecystitis without obstruction: Secondary | ICD-10-CM | POA: Insufficient documentation

## 2019-03-02 DIAGNOSIS — M549 Dorsalgia, unspecified: Secondary | ICD-10-CM | POA: Insufficient documentation

## 2019-03-02 DIAGNOSIS — K8020 Calculus of gallbladder without cholecystitis without obstruction: Secondary | ICD-10-CM | POA: Diagnosis present

## 2019-03-02 DIAGNOSIS — F419 Anxiety disorder, unspecified: Secondary | ICD-10-CM | POA: Insufficient documentation

## 2019-03-02 DIAGNOSIS — Z8249 Family history of ischemic heart disease and other diseases of the circulatory system: Secondary | ICD-10-CM | POA: Insufficient documentation

## 2019-03-02 DIAGNOSIS — K828 Other specified diseases of gallbladder: Secondary | ICD-10-CM | POA: Insufficient documentation

## 2019-03-02 DIAGNOSIS — K259 Gastric ulcer, unspecified as acute or chronic, without hemorrhage or perforation: Secondary | ICD-10-CM | POA: Insufficient documentation

## 2019-03-02 HISTORY — PX: CHOLECYSTECTOMY: SHX55

## 2019-03-02 SURGERY — LAPAROSCOPIC CHOLECYSTECTOMY
Anesthesia: General | Site: Abdomen

## 2019-03-02 MED ORDER — TRAMADOL HCL 50 MG PO TABS
ORAL_TABLET | ORAL | Status: AC
  Filled 2019-03-02: qty 1

## 2019-03-02 MED ORDER — CEFAZOLIN SODIUM-DEXTROSE 2-4 GM/100ML-% IV SOLN
2.0000 g | INTRAVENOUS | Status: AC
  Administered 2019-03-02: 2 g via INTRAVENOUS

## 2019-03-02 MED ORDER — ONDANSETRON HCL 4 MG/2ML IJ SOLN
INTRAMUSCULAR | Status: AC
  Filled 2019-03-02: qty 2

## 2019-03-02 MED ORDER — DEXAMETHASONE SODIUM PHOSPHATE 10 MG/ML IJ SOLN
INTRAMUSCULAR | Status: AC
  Filled 2019-03-02: qty 1

## 2019-03-02 MED ORDER — DEXAMETHASONE SODIUM PHOSPHATE 10 MG/ML IJ SOLN
INTRAMUSCULAR | Status: DC | PRN
  Administered 2019-03-02: 10 mg via INTRAVENOUS

## 2019-03-02 MED ORDER — TRAMADOL HCL 50 MG PO TABS: 50.0000 mg | ORAL_TABLET | Freq: Four times a day (QID) | ORAL | 0 refills | Status: DC | PRN

## 2019-03-02 MED ORDER — 0.9 % SODIUM CHLORIDE (POUR BTL) OPTIME
TOPICAL | Status: DC | PRN
  Administered 2019-03-02: 1000 mL

## 2019-03-02 MED ORDER — PROPOFOL 10 MG/ML IV BOLUS
INTRAVENOUS | Status: DC | PRN
  Administered 2019-03-02: 30 mg via INTRAVENOUS
  Administered 2019-03-02: 120 mg via INTRAVENOUS

## 2019-03-02 MED ORDER — CELECOXIB 200 MG PO CAPS
ORAL_CAPSULE | ORAL | Status: AC
  Filled 2019-03-02: qty 1

## 2019-03-02 MED ORDER — CELECOXIB 200 MG PO CAPS
200.0000 mg | ORAL_CAPSULE | ORAL | Status: AC
  Administered 2019-03-02: 200 mg via ORAL

## 2019-03-02 MED ORDER — ROCURONIUM BROMIDE 10 MG/ML (PF) SYRINGE
PREFILLED_SYRINGE | INTRAVENOUS | Status: AC
  Filled 2019-03-02: qty 10

## 2019-03-02 MED ORDER — SUGAMMADEX SODIUM 200 MG/2ML IV SOLN
INTRAVENOUS | Status: DC | PRN
  Administered 2019-03-02: 163.2 mg via INTRAVENOUS

## 2019-03-02 MED ORDER — GABAPENTIN 300 MG PO CAPS
300.0000 mg | ORAL_CAPSULE | ORAL | Status: AC
  Administered 2019-03-02: 300 mg via ORAL

## 2019-03-02 MED ORDER — BUPIVACAINE-EPINEPHRINE 0.25% -1:200000 IJ SOLN
INTRAMUSCULAR | Status: DC | PRN
  Administered 2019-03-02: 20 mL

## 2019-03-02 MED ORDER — SODIUM CHLORIDE 0.9 % IR SOLN
Status: DC | PRN
  Administered 2019-03-02: 1000 mL

## 2019-03-02 MED ORDER — ONDANSETRON HCL 4 MG/2ML IJ SOLN
INTRAMUSCULAR | Status: DC | PRN
  Administered 2019-03-02: 4 mg via INTRAVENOUS

## 2019-03-02 MED ORDER — MIDAZOLAM HCL 2 MG/2ML IJ SOLN
INTRAMUSCULAR | Status: AC
  Filled 2019-03-02: qty 2

## 2019-03-02 MED ORDER — BUPIVACAINE-EPINEPHRINE (PF) 0.25% -1:200000 IJ SOLN
INTRAMUSCULAR | Status: AC
  Filled 2019-03-02: qty 30

## 2019-03-02 MED ORDER — FENTANYL CITRATE (PF) 250 MCG/5ML IJ SOLN
INTRAMUSCULAR | Status: AC
  Filled 2019-03-02: qty 5

## 2019-03-02 MED ORDER — ACETAMINOPHEN 500 MG PO TABS
1000.0000 mg | ORAL_TABLET | ORAL | Status: AC
  Administered 2019-03-02: 1000 mg via ORAL

## 2019-03-02 MED ORDER — MIDAZOLAM HCL 2 MG/2ML IJ SOLN
INTRAMUSCULAR | Status: DC | PRN
  Administered 2019-03-02: 1 mg via INTRAVENOUS

## 2019-03-02 MED ORDER — LIDOCAINE 2% (20 MG/ML) 5 ML SYRINGE
INTRAMUSCULAR | Status: AC
  Filled 2019-03-02: qty 5

## 2019-03-02 MED ORDER — ROCURONIUM BROMIDE 10 MG/ML (PF) SYRINGE
PREFILLED_SYRINGE | INTRAVENOUS | Status: DC | PRN
  Administered 2019-03-02: 70 mg via INTRAVENOUS

## 2019-03-02 MED ORDER — GABAPENTIN 300 MG PO CAPS
ORAL_CAPSULE | ORAL | Status: AC
  Filled 2019-03-02: qty 1

## 2019-03-02 MED ORDER — ACETAMINOPHEN 500 MG PO TABS
ORAL_TABLET | ORAL | Status: AC
  Filled 2019-03-02: qty 2

## 2019-03-02 MED ORDER — STERILE WATER FOR IRRIGATION IR SOLN
Status: DC | PRN
  Administered 2019-03-02: 1000 mL

## 2019-03-02 MED ORDER — FENTANYL CITRATE (PF) 250 MCG/5ML IJ SOLN
INTRAMUSCULAR | Status: DC | PRN
  Administered 2019-03-02 (×4): 50 ug via INTRAVENOUS

## 2019-03-02 MED ORDER — LIDOCAINE 2% (20 MG/ML) 5 ML SYRINGE
INTRAMUSCULAR | Status: DC | PRN
  Administered 2019-03-02: 40 mg via INTRAVENOUS

## 2019-03-02 MED ORDER — LACTATED RINGERS IV SOLN
INTRAVENOUS | Status: DC
  Administered 2019-03-02: 09:00:00 via INTRAVENOUS

## 2019-03-02 MED ORDER — TRAMADOL HCL 50 MG PO TABS
50.0000 mg | ORAL_TABLET | Freq: Once | ORAL | Status: AC
  Administered 2019-03-02: 50 mg via ORAL

## 2019-03-02 MED ORDER — CEFAZOLIN SODIUM-DEXTROSE 2-4 GM/100ML-% IV SOLN
INTRAVENOUS | Status: AC
  Filled 2019-03-02: qty 100

## 2019-03-02 SURGICAL SUPPLY — 32 items
APPLIER CLIP 5 13 M/L LIGAMAX5 (MISCELLANEOUS) ×2
CANISTER SUCT 3000ML PPV (MISCELLANEOUS) ×2 IMPLANT
CHLORAPREP W/TINT 26 (MISCELLANEOUS) ×2 IMPLANT
CLIP APPLIE 5 13 M/L LIGAMAX5 (MISCELLANEOUS) ×1 IMPLANT
COVER SURGICAL LIGHT HANDLE (MISCELLANEOUS) ×2 IMPLANT
COVER WAND RF STERILE (DRAPES) ×2 IMPLANT
DERMABOND ADVANCED (GAUZE/BANDAGES/DRESSINGS) ×1
DERMABOND ADVANCED .7 DNX12 (GAUZE/BANDAGES/DRESSINGS) ×1 IMPLANT
ELECT REM PT RETURN 9FT ADLT (ELECTROSURGICAL) ×2
ELECTRODE REM PT RTRN 9FT ADLT (ELECTROSURGICAL) ×1 IMPLANT
GLOVE SURG SIGNA 7.5 PF LTX (GLOVE) ×2 IMPLANT
GOWN STRL REUS W/ TWL LRG LVL3 (GOWN DISPOSABLE) ×2 IMPLANT
GOWN STRL REUS W/ TWL XL LVL3 (GOWN DISPOSABLE) ×1 IMPLANT
GOWN STRL REUS W/TWL LRG LVL3 (GOWN DISPOSABLE) ×2
GOWN STRL REUS W/TWL XL LVL3 (GOWN DISPOSABLE) ×1
KIT BASIN OR (CUSTOM PROCEDURE TRAY) ×2 IMPLANT
KIT TURNOVER KIT B (KITS) ×2 IMPLANT
NS IRRIG 1000ML POUR BTL (IV SOLUTION) ×2 IMPLANT
PAD ARMBOARD 7.5X6 YLW CONV (MISCELLANEOUS) ×2 IMPLANT
POUCH SPECIMEN RETRIEVAL 10MM (ENDOMECHANICALS) ×2 IMPLANT
SCISSORS LAP 5X35 DISP (ENDOMECHANICALS) ×2 IMPLANT
SET IRRIG TUBING LAPAROSCOPIC (IRRIGATION / IRRIGATOR) ×2 IMPLANT
SET TUBE SMOKE EVAC HIGH FLOW (TUBING) ×2 IMPLANT
SLEEVE ENDOPATH XCEL 5M (ENDOMECHANICALS) ×4 IMPLANT
SPECIMEN JAR SMALL (MISCELLANEOUS) ×2 IMPLANT
SUT MNCRL AB 4-0 PS2 18 (SUTURE) ×2 IMPLANT
TOWEL GREEN STERILE (TOWEL DISPOSABLE) ×2 IMPLANT
TOWEL GREEN STERILE FF (TOWEL DISPOSABLE) ×2 IMPLANT
TRAY LAPAROSCOPIC MC (CUSTOM PROCEDURE TRAY) ×2 IMPLANT
TROCAR XCEL BLUNT TIP 100MML (ENDOMECHANICALS) ×2 IMPLANT
TROCAR XCEL NON-BLD 5MMX100MML (ENDOMECHANICALS) ×2 IMPLANT
WATER STERILE IRR 1000ML POUR (IV SOLUTION) ×2 IMPLANT

## 2019-03-02 NOTE — Discharge Instructions (Signed)
[facility] ______[facility] °LAPAROSCOPIC SURGERY: POST OP INSTRUCTIONS °Always review your discharge instruction sheet given to you by the facility where your surgery was performed. °IF YOU HAVE DISABILITY OR FAMILY LEAVE FORMS, YOU MUST BRING THEM TO THE OFFICE FOR PROCESSING.   °DO NOT GIVE THEM TO YOUR DOCTOR. ° °1. A prescription for pain medication may be given to you upon discharge.  Take your pain medication as prescribed, if needed.  If narcotic pain medicine is not needed, then you may take acetaminophen (Tylenol) or ibuprofen (Advil) as needed. °2. Take your usually prescribed medications unless otherwise directed. °3. If you need a refill on your pain medication, please contact your pharmacy.  They will contact our office to request authorization. Prescriptions will not be filled after 5pm or on week-ends. °4. You should follow a light diet the first few days after arrival home, such as soup and crackers, etc.  Be sure to include lots of fluids daily. °5. Most patients will experience some swelling and bruising in the area of the incisions.  Ice packs will help.  Swelling and bruising can take several days to resolve.  °6. It is common to experience some constipation if taking pain medication after surgery.  Increasing fluid intake and taking a stool softener (such as Colace) will usually help or prevent this problem from occurring.  A mild laxative (Milk of Magnesia or Miralax) should be taken according to package instructions if there are no bowel movements after 48 hours. °7. Unless discharge instructions indicate otherwise, you may remove your bandages 24-48 hours after surgery, and you may shower at that time.  You may have steri-strips (small skin tapes) in place directly over the incision.  These strips should be left on the skin for 7-10 days.  If your surgeon used skin glue on the incision, you may shower in 24 hours.  The glue will flake off over the next 2-3 weeks.  Any sutures or  staples will be removed at the office during your follow-up visit. °8. ACTIVITIES:  You may resume regular (light) daily activities beginning the next day--such as daily self-care, walking, climbing stairs--gradually increasing activities as tolerated.  You may have sexual intercourse when it is comfortable.  Refrain from any heavy lifting or straining until approved by your doctor. °a. You may drive when you are no longer taking prescription pain medication, you can comfortably wear a seatbelt, and you can safely maneuver your car and apply brakes. °b. RETURN TO WORK:  __________________________________________________________ °9. You should see your doctor in the office for a follow-up appointment approximately 2-3 weeks after your surgery.  Make sure that you call for this appointment within a day or two after you arrive home to insure a convenient appointment time. °10. OTHER INSTRUCTIONS:OK TO SHOWER STARTING TOMORROW °11. ICE PACK, TYLENOL, AND IBUPROFEN ALSO FOR PAIN °12. NO LIFTING MORE THAN 15 TO 20 POUNDS FOR 2 WEEKS __________________________________________________________________________________________________________________________ __________________________________________________________________________________________________________________________ °WHEN TO CALL YOUR DOCTOR: °1. Fever over 101.0 °2. Inability to urinate °3. Continued bleeding from incision. °4. Increased pain, redness, or drainage from the incision. °5. Increasing abdominal pain ° °The clinic staff is available to answer your questions during regular business hours.  Please don’t hesitate to call and ask to speak to one of the nurses for clinical concerns.  If you have a medical emergency, go to the nearest emergency room or call 911.  A surgeon from [facility] is always on call at the hospital. °[address],   [address], [place], [place]  [address] ? [address] [address], [place], [place]   [address] °[phone] ?  [phone] ? FAX [phone] °Web site: [email or web address] °

## 2019-03-02 NOTE — Interval H&P Note (Signed)
History and Physical Interval Note:no change in H and P  03/02/2019 7:20 AM  Tanya Weber  has presented today for surgery, with the diagnosis of SYMPTOMATIC CHOLELITHIASIS.  The various methods of treatment have been discussed with the patient and family. After consideration of risks, benefits and other options for treatment, the patient has consented to  Procedure(s): LAPAROSCOPIC CHOLECYSTECTOMY (N/A) as a surgical intervention.  The patient's history has been reviewed, patient examined, no change in status, stable for surgery.  I have reviewed the patient's chart and labs.  Questions were answered to the patient's satisfaction.     Coralie Keens

## 2019-03-02 NOTE — Anesthesia Procedure Notes (Signed)
Procedure Name: Intubation Date/Time: 03/02/2019 9:00 AM Performed by: Alain Marion, CRNA Pre-anesthesia Checklist: Patient identified, Emergency Drugs available, Suction available, Patient being monitored and Timeout performed Patient Re-evaluated:Patient Re-evaluated prior to induction Oxygen Delivery Method: Circle system utilized Preoxygenation: Pre-oxygenation with 100% oxygen Induction Type: IV induction Ventilation: Mask ventilation without difficulty Laryngoscope Size: Mac and 3 Grade View: Grade I Tube type: Oral Tube size: 7.0 mm Number of attempts: 1 Airway Equipment and Method: Stylet Secured at: 19 cm Tube secured with: Tape Dental Injury: Teeth and Oropharynx as per pre-operative assessment  Comments: ETT inserted by Anderson Malta, EMT student under supervision. No complications noted, airway as per preop assessment.

## 2019-03-02 NOTE — Anesthesia Preprocedure Evaluation (Addendum)
Anesthesia Evaluation  Patient identified by MRN, date of birth, ID band Patient awake    Reviewed: Allergy & Precautions, NPO status , Patient's Chart, lab work & pertinent test results  Airway Mallampati: I  TM Distance: >3 FB Neck ROM: Full    Dental no notable dental hx. (+) Teeth Intact, Dental Advisory Given   Pulmonary neg pulmonary ROS,    Pulmonary exam normal breath sounds clear to auscultation       Cardiovascular negative cardio ROS Normal cardiovascular exam Rhythm:Regular Rate:Normal     Neuro/Psych negative neurological ROS  negative psych ROS   GI/Hepatic negative GI ROS, Neg liver ROS,   Endo/Other  negative endocrine ROS  Renal/GU negative Renal ROS  negative genitourinary   Musculoskeletal negative musculoskeletal ROS (+)   Abdominal   Peds  Hematology negative hematology ROS (+)   Anesthesia Other Findings   Reproductive/Obstetrics                            Anesthesia Physical Anesthesia Plan  ASA: II  Anesthesia Plan: General   Post-op Pain Management:    Induction: Intravenous  PONV Risk Score and Plan: 3 and Midazolam, Dexamethasone and Ondansetron  Airway Management Planned: Oral ETT  Additional Equipment:   Intra-op Plan:   Post-operative Plan: Extubation in OR  Informed Consent: I have reviewed the patients History and Physical, chart, labs and discussed the procedure including the risks, benefits and alternatives for the proposed anesthesia with the patient or authorized representative who has indicated his/her understanding and acceptance.     Dental advisory given  Plan Discussed with: CRNA  Anesthesia Plan Comments:         Anesthesia Quick Evaluation

## 2019-03-02 NOTE — Anesthesia Postprocedure Evaluation (Signed)
Anesthesia Post Note  Patient: Tanya Weber  Procedure(s) Performed: LAPAROSCOPIC CHOLECYSTECTOMY (N/A Abdomen)     Patient location during evaluation: PACU Anesthesia Type: General Level of consciousness: awake and alert Pain management: pain level controlled Vital Signs Assessment: post-procedure vital signs reviewed and stable Respiratory status: spontaneous breathing, nonlabored ventilation, respiratory function stable and patient connected to nasal cannula oxygen Cardiovascular status: blood pressure returned to baseline and stable Postop Assessment: no apparent nausea or vomiting Anesthetic complications: no    Last Vitals:  Vitals:   03/02/19 1015 03/02/19 1030  BP: (!) 158/85 (!) 149/87  Pulse: 63 63  Resp: 18 18  Temp: 36.5 C   SpO2: 99% 100%    Last Pain:  Vitals:   03/02/19 1015  PainSc: 0-No pain                 Ayven Pheasant L Persia Lint[place]

## 2019-03-02 NOTE — Transfer of Care (Signed)
Immediate Anesthesia Transfer of Care Note  Patient: Tanya Weber  Procedure(s) Performed: LAPAROSCOPIC CHOLECYSTECTOMY (N/A Abdomen)  Patient Location: PACU  Anesthesia Type:General  Level of Consciousness: drowsy, patient cooperative and responds to stimulation  Airway & Oxygen Therapy: Patient Spontanous Breathing and Patient connected to face mask oxygen  Post-op Assessment: Report given to RN  Post vital signs: Reviewed and stable  Last Vitals:  Vitals Value Taken Time  BP 163/96 03/02/19 0945  Temp    Pulse 80 03/02/19 0947  Resp 20 03/02/19 0947  SpO2 100 % 03/02/19 0947  Vitals shown include unvalidated device data.  Last Pain: There were no vitals filed for this visit.       Complications: No apparent anesthesia complications

## 2019-03-02 NOTE — Op Note (Signed)

## 2019-03-03 ENCOUNTER — Encounter (HOSPITAL_COMMUNITY): Payer: Self-pay | Admitting: Surgery

## 2019-03-03 LAB — SURGICAL PATHOLOGY

## 2019-05-06 ENCOUNTER — Other Ambulatory Visit: Payer: Self-pay | Admitting: Obstetrics and Gynecology

## 2019-05-06 DIAGNOSIS — Z1231 Encounter for screening mammogram for malignant neoplasm of breast: Secondary | ICD-10-CM

## 2019-06-23 ENCOUNTER — Telehealth: Payer: Self-pay | Admitting: Obstetrics and Gynecology

## 2019-06-23 NOTE — Telephone Encounter (Signed)
Left message on voicemail to call and reschedule cancelled appointment. °

## 2019-07-03 ENCOUNTER — Ambulatory Visit
Admission: RE | Admit: 2019-07-03 | Discharge: 2019-07-03 | Disposition: A | Discharge status: Home / Self Care | Payer: 59 | Source: Ambulatory Visit | Attending: Obstetrics and Gynecology | Admitting: Obstetrics and Gynecology

## 2019-07-03 ENCOUNTER — Other Ambulatory Visit: Payer: Self-pay

## 2019-07-03 DIAGNOSIS — Z1231 Encounter for screening mammogram for malignant neoplasm of breast: Secondary | ICD-10-CM

## 2019-07-22 NOTE — Progress Notes (Signed)
64 y.o. [id number] Married [place] American female here for annual exam.    Had her gallbladder removed.  Feeling so much better.  Has some IBS issues.   Wants general labs including vitamin A, D, Mg.  She understands her insurance may not pay.   Dealing with weight gain.   Doing telemedicine.   PCP: London Pepper, MD   Patient's last menstrual period was 11/10/2011.           Sexually active: Yes.    The current method of family planning is Vasectomy/post menopausal status.    Exercising: Yes.    Eliptical 30 minutes/3x/week Smoker:  no  Health Maintenance: Pap: 07-25-18 Neg:Neg HR HPV, 07-19-17 Neg:Neg HR HPV, 07-06-16 Neg:Neg HR HPV History of abnormal Pap:  Yes, 06-15-15 LGSIL:Neg HR HPV MMG: 07-03-19 3D/Neg/density A/BiRads1 Colonoscopy: cologuard 2018 normal per patient;2008 neg.colonoscopy BMD:   n/a  Result  n/a TDaP:  11-17-16 Gardasil:   no HIV: 07-06-16 NR Hep C: 07-06-16 Neg Screening Labs:  Today.    reports that she has never smoked. She has never used smokeless tobacco. She reports that she does not drink alcohol or use drugs.  Past Medical History:  Diagnosis Date  . Elevated testosterone level in female 10/2017   normal free testosterone  . Elevated TSH 07/19/2017   Normal free T3 and free T4  . Environmental allergies   . Fibroids   . History of postpartum depression 1987  . Hypermagnesemia 2019    Past Surgical History:  Procedure Laterality Date  . CESAREAN SECTION    . CHOLECYSTECTOMY N/A 03/02/2019   Procedure: LAPAROSCOPIC CHOLECYSTECTOMY;  Surgeon: Coralie Keens, MD;  Location: [facility];  Service: General;  Laterality: N/A;    Current Outpatient Medications  Medication Sig Dispense Refill  . Camphor-Eucalyptus-Menthol (VICKS VAPORUB EX) Apply 1 application topically daily as needed (congestion).    . famotidine (PEPCID) 20 MG tablet Take 20 mg by mouth as needed for heartburn or indigestion.    . Menthol, Topical Analgesic, (BENGAY EX) Apply 1  application topically daily as needed (muscle pain).    . Multiple Vitamin (MULTIVITAMIN) capsule Take 1 capsule by mouth daily.    . Omega-3 Fatty Acids (FISH OIL) 1000 MG CAPS Take 1 capsule by mouth daily.     No current facility-administered medications for this visit.    Family History  Problem Relation Age of Onset  . Heart attack Father 73       deceased  . Diabetes Brother 22       IDDM  . Hypertension Sister     Review of Systems  All other systems reviewed and are negative.   Exam:   BP 110/78 (Cuff Size: Large)   Pulse 80   Temp (!) 97.2 F (36.2 C) (Temporal)   Resp 18   Ht 5\' 1"  (1.549 m)   Wt 183 lb 3.2 oz (83.1 kg)   LMP 11/10/2011   BMI 34.62 kg/m     General appearance: alert, cooperative and appears stated age Head: normocephalic, without obvious abnormality, atraumatic Neck: no adenopathy, supple, symmetrical, trachea midline and thyroid normal to inspection and palpation Lungs: clear to auscultation bilaterally Breasts: normal appearance, no masses or tenderness, No nipple retraction or dimpling, No nipple discharge or bleeding, No axillary adenopathy Heart: regular rate and rhythm Abdomen: soft, non-tender; no masses, no organomegaly Extremities: extremities normal, atraumatic, no cyanosis or edema Skin: skin color, texture, turgor normal. No rashes or lesions Lymph nodes: cervical, supraclavicular, and axillary  nodes normal. Neurologic: grossly normal  Pelvic: External genitalia:  no lesions              No abnormal inguinal nodes palpated.              Urethra:  normal appearing urethra with no masses, tenderness or lesions              Bartholins and Skenes: normal                 Vagina: normal appearing vagina with normal color and discharge, no lesions              Cervix: no lesions              Pap taken: No. Bimanual Exam:  Uterus:  normal size, contour, position, consistency, mobility, non-tender              Adnexa: no mass, fullness,  tenderness              Rectal exam: Yes.  .  Confirms.              Anus:  normal sphincter tone, no lesions  Chaperone was present for exam.  Karmen Bongo.  Assessment:   Well woman visit with normal exam. Hx LGSIL.  Hx fibroids.   Plan: Mammogram screening discussed. Self breast awareness reviewed. Pap and HR HPV as above.  New guidelines discussed.  Guidelines for Calcium, Vitamin D, regular exercise program including cardiovascular and weight bearing exercise. Routine labs.  Follow up annually and prn.

## 2019-07-23 ENCOUNTER — Other Ambulatory Visit: Payer: Self-pay

## 2019-07-27 ENCOUNTER — Ambulatory Visit (INDEPENDENT_AMBULATORY_CARE_PROVIDER_SITE_OTHER): Payer: 59 | Admitting: Obstetrics and Gynecology

## 2019-07-27 ENCOUNTER — Other Ambulatory Visit: Payer: Self-pay

## 2019-07-27 ENCOUNTER — Encounter: Payer: Self-pay | Admitting: Obstetrics and Gynecology

## 2019-07-27 VITALS — BP 110/78 | HR 80 | Temp 97.2°F | Resp 18 | Ht 61.0 in | Wt 183.2 lb

## 2019-07-27 DIAGNOSIS — Z01419 Encounter for gynecological examination (general) (routine) without abnormal findings: Secondary | ICD-10-CM | POA: Diagnosis not present

## 2019-07-27 NOTE — Patient Instructions (Signed)

## 2019-07-29 ENCOUNTER — Encounter: Payer: Self-pay | Admitting: Obstetrics and Gynecology

## 2019-07-29 LAB — SPECIMEN STATUS REPORT

## 2019-07-29 LAB — T4, FREE: Free T4: 1.31 ng/dL (ref 0.82–1.77)

## 2019-07-31 ENCOUNTER — Ambulatory Visit: Payer: 59 | Admitting: Obstetrics and Gynecology

## 2019-07-31 LAB — VITAMIN D 25 HYDROXY (VIT D DEFICIENCY, FRACTURES): Vit D, 25-Hydroxy: 36.9 ng/mL (ref 30.0–100.0)

## 2019-07-31 LAB — COMPREHENSIVE METABOLIC PANEL
ALT: 22 IU/L (ref 0–32)
AST: 27 IU/L (ref 0–40)
Albumin/Globulin Ratio: 1.8 (ref 1.2–2.2)
Albumin: 4.4 g/dL (ref 3.8–4.8)
Alkaline Phosphatase: 83 IU/L (ref 39–117)
BUN/Creatinine Ratio: 15 (ref 12–28)
BUN: 14 mg/dL (ref 8–27)
Bilirubin Total: 0.3 mg/dL (ref 0.0–1.2)
CO2: 24 mmol/L (ref 20–29)
Calcium: 9.5 mg/dL (ref 8.7–10.3)
Chloride: 107 mmol/L — ABNORMAL HIGH (ref 96–106)
Creatinine, Ser: 0.92 mg/dL (ref 0.57–1.00)
GFR calc Af Amer: 77 mL/min/{1.73_m2} (ref >59)
GFR calc non Af Amer: 67 mL/min/{1.73_m2} (ref >59)
Globulin, Total: 2.5 g/dL (ref 1.5–4.5)
Glucose: 83 mg/dL (ref 65–99)
Potassium: 3.9 mmol/L (ref 3.5–5.2)
Sodium: 144 mmol/L (ref 134–144)
Total Protein: 6.9 g/dL (ref 6.0–8.5)

## 2019-07-31 LAB — VITAMIN A: Vitamin A: 52.8 ug/dL (ref 22.0–69.5)

## 2019-07-31 LAB — CBC
Hematocrit: 45.8 % (ref 34.0–46.6)
Hemoglobin: 15 g/dL (ref 11.1–15.9)
MCH: 28.5 pg (ref 26.6–33.0)
MCHC: 32.8 g/dL (ref 31.5–35.7)
MCV: 87 fL (ref 79–97)
Platelets: 173 10*3/uL (ref 150–450)
RBC: 5.27 x10E6/uL (ref 3.77–5.28)
RDW: 13.4 % (ref 11.7–15.4)
WBC: 5 10*3/uL (ref 3.4–10.8)

## 2019-07-31 LAB — HEMOGLOBIN A1C
Est. average glucose Bld gHb Est-mCnc: 123 mg/dL
Hgb A1c MFr Bld: 5.9 % — ABNORMAL HIGH (ref 4.8–5.6)

## 2019-07-31 LAB — LIPID PANEL
Chol/HDL Ratio: 2.7 ratio (ref 0.0–4.4)
Cholesterol, Total: 186 mg/dL (ref 100–199)
HDL: 70 mg/dL (ref >39)
LDL Chol Calc (NIH): 105 mg/dL — ABNORMAL HIGH (ref 0–99)
Triglycerides: 58 mg/dL (ref 0–149)
VLDL Cholesterol Cal: 11 mg/dL (ref 5–40)

## 2019-07-31 LAB — TSH: TSH: 5.73 u[IU]/mL — ABNORMAL HIGH (ref 0.450–4.500)

## 2019-07-31 LAB — VITAMIN B12: Vitamin B-12: 667 pg/mL (ref 232–1245)

## 2019-07-31 LAB — MAGNESIUM: Magnesium: 2.1 mg/dL (ref 1.6–2.3)

## 2019-08-21 ENCOUNTER — Ambulatory Visit: Payer: 59 | Attending: Internal Medicine

## 2019-08-21 DIAGNOSIS — Z23 Encounter for immunization: Secondary | ICD-10-CM

## 2019-08-21 NOTE — Progress Notes (Signed)
   Covid-19 Vaccination Clinic  Name:  Jonikka Lascola    MRN: [id number] DOB: [birth year]  08/21/2019  Ms. Schult was observed post Covid-19 immunization for 15 minutes without incident. She was provided with Vaccine Information Sheet and instruction to access the V-Safe system.   Ms. Geelan was instructed to call 911 with any severe reactions post vaccine: Marland Kitchen Difficulty breathing  . Swelling of face and throat  . A fast heartbeat  . A bad rash all over body  . Dizziness and weakness   Immunizations Administered    Name Date Dose VIS Date Route   Pfizer COVID-19 Vaccine 08/21/2019  9:00 AM 0.3 mL 05/22/2019 Intramuscular   Manufacturer: [place]   Lot: [id number]   [place]: [id number]

## 2019-09-16 ENCOUNTER — Ambulatory Visit: Payer: 59 | Attending: Internal Medicine

## 2019-09-16 DIAGNOSIS — Z23 Encounter for immunization: Secondary | ICD-10-CM

## 2019-09-16 NOTE — Progress Notes (Signed)
   Covid-19 Vaccination Clinic  Name:  Tanya Weber    MRN: [id number] DOB: [birth year]  09/16/2019  Tanya Weber was observed post Covid-19 immunization for 15 minutes without incident. She was provided with Vaccine Information Sheet and instruction to access the V-Safe system.   Tanya Weber was instructed to call 911 with any severe reactions post vaccine: Marland Kitchen Difficulty breathing  . Swelling of face and throat  . A fast heartbeat  . A bad rash all over body  . Dizziness and weakness   Immunizations Administered    Name Date Dose VIS Date Route   Pfizer COVID-19 Vaccine 09/16/2019  8:15 AM 0.3 mL 05/22/2019 Intramuscular   Manufacturer: [employer]   Lot: [id number]   [place]: [id number]

## 2019-12-21 ENCOUNTER — Other Ambulatory Visit: Payer: 59

## 2020-01-14 ENCOUNTER — Ambulatory Visit: Payer: Self-pay | Admitting: Obstetrics and Gynecology

## 2020-01-20 ENCOUNTER — Ambulatory Visit: Payer: Self-pay | Admitting: Obstetrics and Gynecology

## 2020-01-20 ENCOUNTER — Telehealth: Payer: Self-pay | Admitting: Obstetrics and Gynecology

## 2020-01-20 NOTE — Telephone Encounter (Signed)
Patient cancelled her appointment for 6 month follow up for labs. She states her flight was delayed and she just got back home from [place] last night. Patient wants to reschedule this appointment for sometime in September. Patient is aware of the cancellation policy.

## 2020-01-20 NOTE — Telephone Encounter (Signed)
Per review of 07/27/19 lab results, needs 40mo f/u with hgbA1c and thyroid  labs.   Spoke with patient. OV r/s to 03/01/20 at 4pm with Dr. Quincy Simmonds.   Patient is agreeable to date and time.   Encounter closed.

## 2020-03-01 ENCOUNTER — Encounter: Payer: Self-pay | Admitting: Obstetrics and Gynecology

## 2020-03-01 ENCOUNTER — Ambulatory Visit (INDEPENDENT_AMBULATORY_CARE_PROVIDER_SITE_OTHER): Payer: 59 | Admitting: Obstetrics and Gynecology

## 2020-03-01 ENCOUNTER — Other Ambulatory Visit: Payer: Self-pay

## 2020-03-01 VITALS — BP 124/66 | HR 70 | Ht 61.0 in | Wt 188.8 lb

## 2020-03-01 DIAGNOSIS — R7309 Other abnormal glucose: Secondary | ICD-10-CM | POA: Diagnosis not present

## 2020-03-01 DIAGNOSIS — R7989 Other specified abnormal findings of blood chemistry: Secondary | ICD-10-CM

## 2020-03-01 DIAGNOSIS — Z6835 Body mass index (BMI) 35.0-35.9, adult: Secondary | ICD-10-CM | POA: Diagnosis not present

## 2020-03-01 NOTE — Progress Notes (Signed)
GYNECOLOGY  VISIT   HPI: 64 y.o.   Married  [place] American  female   [phone] with Patient's last menstrual period was 11/10/2011.   here for 6 month follow up and labs.  Patient has elevated TSH and normal free T4 and has elevated A1C of 5.9.  States she is feeling much better.   Her digestion is better.  She is having regular bowel function. She is eating more vegetables.   Her anxiety is improved.   Energy level is good.   No excessive thirst or craving sweets. Has decreased hunger.  No hair loss.  No skin dryness.   Exercising regularly - treadmill, ladder, and weights.   GYNECOLOGIC HISTORY: Patient's last menstrual period was 11/10/2011. Contraception:  Vasectomy/PMP Menopausal hormone therapy: none Last mammogram: 07-03-19 3D/Neg/density A/BiRads1 Last pap smear: 07-25-18 Neg:Neg HR HPV,07-19-17 Neg:Neg HR HPV, 07-06-16 Neg:Neg HR HPV        OB History    Gravida  4   Para  3   Term  3   Preterm      AB  1   Living  3     SAB  1   TAB      Ectopic      Multiple      Live Births                 Patient Active Problem List   Diagnosis Date Noted  . Fibroids 01/27/2019  . Calcification of ovary 01/27/2019  . RLQ abdominal pain 01/27/2019    Past Medical History:  Diagnosis Date  . Elevated hemoglobin A1c   . Elevated testosterone level in female 10/2017   normal free testosterone  . Elevated TSH 2021   Normal free T4  . Environmental allergies   . Fibroids   . History of postpartum depression 1987  . Hypermagnesemia 2019    Past Surgical History:  Procedure Laterality Date  . CESAREAN SECTION    . CHOLECYSTECTOMY N/A 03/02/2019   Procedure: LAPAROSCOPIC CHOLECYSTECTOMY;  Surgeon: Coralie Keens, MD;  Location: [facility];  Service: General;  Laterality: N/A;    Current Outpatient Medications  Medication Sig Dispense Refill  . Camphor-Eucalyptus-Menthol (VICKS VAPORUB EX) Apply 1 application topically daily as needed  (congestion).    . Cholecalciferol (VITAMIN D3) 125 MCG (5000 UT) CAPS Take 1 capsule by mouth daily.    . Menthol, Topical Analgesic, (BENGAY EX) Apply 1 application topically daily as needed (muscle pain).    . Multiple Vitamin (MULTIVITAMIN) capsule Take 1 capsule by mouth daily.    . Omega-3 Fatty Acids (FISH OIL) 1000 MG CAPS Take 1 capsule by mouth daily.     No current facility-administered medications for this visit.     ALLERGIES: Patient has no known allergies.  Family History  Problem Relation Age of Onset  . Heart attack Father 19       deceased  . Diabetes Brother 22       IDDM  . Hypertension Sister     Social History   Socioeconomic History  . Marital status: Married    Spouse name: Not on file  . Number of children: Not on file  . Years of education: Not on file  . Highest education level: Not on file  Occupational History  . Not on file  Tobacco Use  . Smoking status: Never Smoker  . Smokeless tobacco: Never Used  Vaping Use  . Vaping Use: Never used  Substance and Sexual Activity  .  Alcohol use: No    Alcohol/week: 0.0 standard drinks  . Drug use: No  . Sexual activity: Yes    Partners: Male    Birth control/protection: Other-see comments, Post-menopausal    Comment: vasectomy  Other Topics Concern  . Not on file  Social History Narrative  . Not on file   Social Determinants of Health   Financial Resource Strain:   . Difficulty of Paying Living Expenses: Not on file  Food Insecurity:   . Worried About Charity fundraiser in the Last Year: Not on file  . Ran Out of Food in the Last Year: Not on file  Transportation Needs:   . Lack of Transportation (Medical): Not on file  . Lack of Transportation (Non-Medical): Not on file  Physical Activity:   . Days of Exercise per Week: Not on file  . Minutes of Exercise per Session: Not on file  Stress:   . Feeling of Stress : Not on file  Social Connections:   . Frequency of Communication with  Friends and Family: Not on file  . Frequency of Social Gatherings with Friends and Family: Not on file  . Attends Religious Services: Not on file  . Active Member of Clubs or Organizations: Not on file  . Attends Archivist Meetings: Not on file  . Marital Status: Not on file  Intimate Partner Violence:   . Fear of Current or Ex-Partner: Not on file  . Emotionally Abused: Not on file  . Physically Abused: Not on file  . Sexually Abused: Not on file    Review of Systems  All other systems reviewed and are negative.   PHYSICAL EXAMINATION:    BP 124/66 (Cuff Size: Large)   Pulse 70   Ht 5\' 1"  (1.549 m)   Wt 188 lb 12.8 oz (85.6 kg)   LMP 11/10/2011   BMI 35.67 kg/m     General appearance: alert, cooperative and appears stated age  Chaperone was present for exam.  ASSESSMENT  Subclinical hypothyroidism.  Elevated TSH.  Elevated A1C. BMI 35.6  PLAN   Recheck labs today:  TFTs, A1C, CMP.  [facility] FU for annual exam and prn.

## 2020-03-02 LAB — HEMOGLOBIN A1C
Est. average glucose Bld gHb Est-mCnc: 126 mg/dL
Hgb A1c MFr Bld: 6 % — ABNORMAL HIGH (ref 4.8–5.6)

## 2020-03-02 LAB — COMPREHENSIVE METABOLIC PANEL
ALT: 16 IU/L (ref 0–32)
AST: 19 IU/L (ref 0–40)
Albumin/Globulin Ratio: 1.5 (ref 1.2–2.2)
Albumin: 4.3 g/dL (ref 3.8–4.8)
Alkaline Phosphatase: 77 IU/L (ref 44–121)
BUN/Creatinine Ratio: 18 (ref 12–28)
BUN: 15 mg/dL (ref 8–27)
Bilirubin Total: 0.2 mg/dL (ref 0.0–1.2)
CO2: 24 mmol/L (ref 20–29)
Calcium: 9.2 mg/dL (ref 8.7–10.3)
Chloride: 103 mmol/L (ref 96–106)
Creatinine, Ser: 0.85 mg/dL (ref 0.57–1.00)
GFR calc Af Amer: 84 mL/min/{1.73_m2} (ref >59)
GFR calc non Af Amer: 73 mL/min/{1.73_m2} (ref >59)
Globulin, Total: 2.9 g/dL (ref 1.5–4.5)
Glucose: 80 mg/dL (ref 65–99)
Potassium: 4.1 mmol/L (ref 3.5–5.2)
Sodium: 140 mmol/L (ref 134–144)
Total Protein: 7.2 g/dL (ref 6.0–8.5)

## 2020-03-02 LAB — TSH: TSH: 3.85 u[IU]/mL (ref 0.450–4.500)

## 2020-03-02 LAB — T4, FREE: Free T4: 1.05 ng/dL (ref 0.82–1.77)

## 2020-03-03 ENCOUNTER — Encounter: Payer: Self-pay | Admitting: Obstetrics and Gynecology

## 2020-03-29 ENCOUNTER — Encounter (INDEPENDENT_AMBULATORY_CARE_PROVIDER_SITE_OTHER): Payer: Self-pay | Admitting: Family Medicine

## 2020-03-29 ENCOUNTER — Other Ambulatory Visit: Payer: Self-pay

## 2020-03-29 ENCOUNTER — Ambulatory Visit (INDEPENDENT_AMBULATORY_CARE_PROVIDER_SITE_OTHER): Payer: 59 | Admitting: Family Medicine

## 2020-03-29 VITALS — BP 112/78 | HR 81 | Temp 98.2°F | Ht 61.0 in | Wt 182.0 lb

## 2020-03-29 DIAGNOSIS — R0602 Shortness of breath: Secondary | ICD-10-CM | POA: Insufficient documentation

## 2020-03-29 DIAGNOSIS — E669 Obesity, unspecified: Secondary | ICD-10-CM | POA: Insufficient documentation

## 2020-03-29 DIAGNOSIS — E559 Vitamin D deficiency, unspecified: Secondary | ICD-10-CM | POA: Insufficient documentation

## 2020-03-29 DIAGNOSIS — Z9189 Other specified personal risk factors, not elsewhere classified: Secondary | ICD-10-CM | POA: Diagnosis not present

## 2020-03-29 DIAGNOSIS — Z6834 Body mass index (BMI) 34.0-34.9, adult: Secondary | ICD-10-CM

## 2020-03-29 DIAGNOSIS — K2100 Gastro-esophageal reflux disease with esophagitis, without bleeding: Secondary | ICD-10-CM | POA: Insufficient documentation

## 2020-03-29 DIAGNOSIS — Z0289 Encounter for other administrative examinations: Secondary | ICD-10-CM

## 2020-03-29 DIAGNOSIS — E739 Lactose intolerance, unspecified: Secondary | ICD-10-CM | POA: Insufficient documentation

## 2020-03-29 DIAGNOSIS — R5383 Other fatigue: Secondary | ICD-10-CM | POA: Insufficient documentation

## 2020-03-29 DIAGNOSIS — R7303 Prediabetes: Secondary | ICD-10-CM | POA: Insufficient documentation

## 2020-03-29 DIAGNOSIS — F3289 Other specified depressive episodes: Secondary | ICD-10-CM

## 2020-03-29 DIAGNOSIS — F39 Unspecified mood [affective] disorder: Secondary | ICD-10-CM | POA: Insufficient documentation

## 2020-03-30 LAB — COMPREHENSIVE METABOLIC PANEL
ALT: 17 IU/L (ref 0–32)
AST: 24 IU/L (ref 0–40)
Albumin/Globulin Ratio: 1.6 (ref 1.2–2.2)
Albumin: 4.5 g/dL (ref 3.8–4.8)
Alkaline Phosphatase: 73 IU/L (ref 44–121)
BUN/Creatinine Ratio: 13 (ref 12–28)
BUN: 12 mg/dL (ref 8–27)
Bilirubin Total: 0.4 mg/dL (ref 0.0–1.2)
CO2: 24 mmol/L (ref 20–29)
Calcium: 9.5 mg/dL (ref 8.7–10.3)
Chloride: 106 mmol/L (ref 96–106)
Creatinine, Ser: 0.92 mg/dL (ref 0.57–1.00)
GFR calc Af Amer: 77 mL/min/{1.73_m2} (ref >59)
GFR calc non Af Amer: 66 mL/min/{1.73_m2} (ref >59)
Globulin, Total: 2.8 g/dL (ref 1.5–4.5)
Glucose: 84 mg/dL (ref 65–99)
Potassium: 4.6 mmol/L (ref 3.5–5.2)
Sodium: 142 mmol/L (ref 134–144)
Total Protein: 7.3 g/dL (ref 6.0–8.5)

## 2020-03-30 LAB — HEMOGLOBIN A1C
Est. average glucose Bld gHb Est-mCnc: 123 mg/dL
Hgb A1c MFr Bld: 5.9 % — ABNORMAL HIGH (ref 4.8–5.6)

## 2020-03-30 LAB — FOLATE: Folate: 8.3 ng/mL (ref >3.0)

## 2020-03-30 LAB — CBC WITH DIFFERENTIAL/PLATELET
Basophils Absolute: 0 10*3/uL (ref 0.0–0.2)
Basos: 1 %
EOS (ABSOLUTE): 0.1 10*3/uL (ref 0.0–0.4)
Eos: 2 %
Hematocrit: 43.6 % (ref 34.0–46.6)
Hemoglobin: 14.4 g/dL (ref 11.1–15.9)
Immature Grans (Abs): 0 10*3/uL (ref 0.0–0.1)
Immature Granulocytes: 0 %
Lymphocytes Absolute: 1.8 10*3/uL (ref 0.7–3.1)
Lymphs: 35 %
MCH: 29.1 pg (ref 26.6–33.0)
MCHC: 33 g/dL (ref 31.5–35.7)
MCV: 88 fL (ref 79–97)
Monocytes Absolute: 0.4 10*3/uL (ref 0.1–0.9)
Monocytes: 8 %
Neutrophils Absolute: 2.8 10*3/uL (ref 1.4–7.0)
Neutrophils: 54 %
Platelets: 180 10*3/uL (ref 150–450)
RBC: 4.94 x10E6/uL (ref 3.77–5.28)
RDW: 13.9 % (ref 11.7–15.4)
WBC: 5.1 10*3/uL (ref 3.4–10.8)

## 2020-03-30 LAB — LIPID PANEL
Chol/HDL Ratio: 2.7 ratio (ref 0.0–4.4)
Cholesterol, Total: 191 mg/dL (ref 100–199)
HDL: 72 mg/dL (ref >39)
LDL Chol Calc (NIH): 109 mg/dL — ABNORMAL HIGH (ref 0–99)
Triglycerides: 54 mg/dL (ref 0–149)
VLDL Cholesterol Cal: 10 mg/dL (ref 5–40)

## 2020-03-30 LAB — T4: T4, Total: 9.3 ug/dL (ref 4.5–12.0)

## 2020-03-30 LAB — TSH: TSH: 4.54 u[IU]/mL — ABNORMAL HIGH (ref 0.450–4.500)

## 2020-03-30 LAB — VITAMIN D 25 HYDROXY (VIT D DEFICIENCY, FRACTURES): Vit D, 25-Hydroxy: 38 ng/mL (ref 30.0–100.0)

## 2020-03-30 LAB — INSULIN, RANDOM: INSULIN: 8.2 u[IU]/mL (ref 2.6–24.9)

## 2020-03-30 LAB — VITAMIN B12: Vitamin B-12: 962 pg/mL (ref 232–1245)

## 2020-03-30 LAB — T3: T3, Total: 118 ng/dL (ref 71–180)

## 2020-04-05 NOTE — Progress Notes (Signed)
Dear Dr. Quincy Simmonds,   Thank you for referring Tanya Weber to our clinic. The following note includes my evaluation and treatment recommendations.  Chief Complaint:   OBESITY Tanya Weber (MR# [id number]) is a 64 y.o. female who presents for evaluation and treatment of obesity and related comorbidities. Current BMI is Body mass index is 34.39 kg/m. Tanya Weber has been struggling with her weight for many years and has been unsuccessful in either losing weight, maintaining weight loss, or reaching her healthy weight goal.  Tanya Weber is currently in the action stage of change and ready to dedicate time achieving and maintaining a healthier weight. Tanya Weber is interested in becoming our patient and working on intensive lifestyle modifications including (but not limited to) diet and exercise for weight loss.  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  She is married to her husband, Carolynn Sayers.  She has no pets.  She was referred by OB/GYN.  She is going to [place] this Friday for 1 week.  She has cravings at the end of the work day.  She skips breakfast.  Drinks milk, juice, tea with milk and sugar.   [name] expressed concerns with the Category 1 meal plan and that she did not eat many of those foods from a cultural standpoint.  When we discussed alternative plans of vegetarian with modifications or journaling, she did not think those would work for her either and wanted to go on the Category 1 plan.  Tanya Weber's habits were reviewed today and are as follows: Her family eats meals together, she thinks her family will eat healthier with her, her desired weight loss is 25+ pounds, she has been heavy most of her life, she started gaining weight within the last 2 years, her heaviest weight ever was 200 pounds, she is a picky eater and doesn't like to eat healthier foods, she skips breakfast frequently, she is frequently drinking liquids with calories, she frequently eats larger portions than  normal and she struggles with emotional eating.  This is the patient's first visit at [facility].  The patient's NEW PATIENT PACKET that they filled out prior to today's office visit was reviewed at length and information from that paperwork was included within the following office visit note.    Included in the packet: current and past health history, medications, allergies, ROS, gynecologic history (women only), surgical history, family history, social history, weight history, weight loss surgery history (for those that have had weight loss surgery), nutritional evaluation, mood and food questionnaire along with a depression screening (PHQ9) on all patients, an Epworth questionnaire, sleep habits questionnaire, patient life and health improvement goals questionnaire. These will all be scanned into the patient's chart under media.   During the visit, I independently reviewed the patient's EKG, bioimpedance scale results, and indirect calorimeter results. I used this information to tailor a meal plan for the patient that will help Tanya Weber to lose weight and will improve her obesity-related conditions going forward.  I performed a medically necessary appropriate examination and/or evaluation. I discussed the assessment and treatment plan with the patient. The patient was provided an opportunity to ask questions and all were answered. The patient agreed with the plan and demonstrated an understanding of the instructions. Labs were ordered today (unless patient declined them) and will be reviewed with the patient at our next visit unless more critical results need to be addressed immediately. Clinical information was updated and documented in the EMR.  Time spent  on visit including pre-visit chart review and post-visit care was estimated to be 60-74 minutes.  A separate 15 minutes was spent on risk counseling (see above/below).   Depression Screen Tanya Weber's Food and Mood (modified PHQ-9) score  was 9.  Depression screen PHQ 2/9 03/29/2020  Decreased Interest 1  Down, Depressed, Hopeless 1  PHQ - 2 Score 2  Altered sleeping 1  Tired, decreased energy 1  Change in appetite 2  Feeling bad or failure about yourself  2  Trouble concentrating 1  Moving slowly or fidgety/restless 0  Suicidal thoughts 0  PHQ-9 Score 9  Difficult doing work/chores Somewhat difficult     Assessment/Plan:   1. Other fatigue Dilana admits to daytime somnolence and denies waking up still tired. Patent has a history of symptoms of daytime fatigue. Tanya Weber generally gets 6 hours of sleep per night, and states that she has poor quality sleep. Snoring is not present. Apneic episodes are not present. Epworth Sleepiness Score is 6.  [name] does feel that her weight is causing her energy to be lower than it should be. Fatigue may be related to obesity, depression or many other causes. Labs will be ordered, and in the meanwhile, Tanya Weber will focus on self care including making healthy food choices, increasing physical activity and focusing on stress reduction.  - EKG 12-Lead - Vitamin B12 - CBC with Differential/Platelet - Folate - Insulin, random - Lipid panel - T3 - Comprehensive metabolic panel - Hemoglobin A1c - TSH - T4  2. SOB (shortness of breath) on exertion Tanya Weber notes increasing shortness of breath with exercising and seems to be worsening over time with weight gain. She notes getting out of breath sooner with activity than she used to. This has gotten worse recently. Tanya Weber denies shortness of breath at rest or orthopnea.  Tanya Weber does feel that she gets out of breath more easily that she used to when she exercises. Tanya Weber's shortness of breath appears to be obesity related and exercise induced. She has agreed to work on weight loss and gradually increase exercise to treat her exercise induced shortness of breath. Will continue to monitor closely.  - Hemoglobin A1c  3. Prediabetes Tanya Weber  has a diagnosis of prediabetes based on her elevated HgA1c and was informed this puts her at greater risk of developing diabetes. She will work on diet and exercise to decrease her risk of diabetes.   Lab Results  Component Value Date   HGBA1C 5.9 (H) 03/29/2020   Lab Results  Component Value Date   INSULIN 8.2 03/29/2020   - Insulin, random - Hemoglobin A1c  4. Vitamin D deficiency Lovina Reach has a history of Vitamin D deficiency with resultant generalized fatigue as her primary symptom.  she is taking OTC vitamin D 5,000 IU daily for this deficiency and tolerating it well without side-effect.   Most recent Vitamin D lab reviewed-  level: 36.9 on 07/27/2019.  Plan:   - Discussed importance of vitamin D (as well as calcium) to their health and well-being.   - We reviewed possible symptoms of low Vitamin D including low energy, depressed mood, muscle aches, joint aches, osteoporosis etc.  - We discussed that low Vitamin D levels may be linked to an increased risk of cardiovascular events and even increased risk of cancers- such as colon and breast.   - Educated pt that weight loss will likely improve availability of vitamin D, thus encouraged Nelsie to continue with meal plan and their weight loss efforts to  further improve this condition  - I recommend pt continue OTC vitamin D 5,000 IU daily - Will check vitamin D levelt daoy.  - Informed patient this may be a lifelong thing, and she was encouraged to continue to take the medicine until pt told otherwise.   We will need to monitor levels regularly ( q 3-4 mo on average )  to keep levels within normal limits.   - All pt's questions and concerns regarding this condition addressed  - VITAMIN D 25 Hydroxy (Vit-D Deficiency, Fractures)  5. Gastroesophageal reflux disease with esophagitis, unspecified whether hemorrhage Amarisa is on no medication for this at this time.  She will avoid triggers, as per below.  Counseling . If a  person has gastroesophageal reflux disease (GERD), food and stomach acid move back up into the esophagus and cause symptoms or problems such as damage to the esophagus. . Anti-reflux measures include: raising the head of the bed, avoiding tight clothing or belts, avoiding eating late at night, not lying down shortly after mealtime, and achieving weight loss. . Avoid ASA, NSAID's, caffeine, alcohol, and tobacco.  . OTC Pepcid and/or Tums are often very helpful for as needed use.  Marland Kitchen However, for persisting chronic or daily symptoms, stronger medications like Omeprazole may be needed. . You may need to avoid foods and drinks such as: ? Coffee and tea (with or without caffeine). ? Drinks that contain alcohol. ? Energy drinks and sports drinks. ? Bubbly (carbonated) drinks or sodas. ? Chocolate and cocoa. ? Peppermint and mint flavorings. ? Garlic and onions. ? Horseradish. ? Spicy and acidic foods. These include peppers, chili powder, curry powder, vinegar, hot sauces, and BBQ sauce. ? Citrus fruit juices and citrus fruits, such as oranges, lemons, and limes. ? Tomato-based foods. These include red sauce, chili, salsa, and pizza with red sauce. ? Fried and fatty foods. These include donuts, french fries, potato chips, and high-fat dressings. ? High-fat meats. These include hot dogs, rib eye steak, sausage, ham, and bacon.  6. Lactose intolerance We reviewed options of [place] yogurt, [place]life, cheeses, etc., that are lactose free.  7. Other depression with emotional eating With anxiety as well.  PHQ-9 is 9.  She is on no medication.  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  No SI and I rec pt pursue EAP counseling through her work at this time to help you cope with these stressors better.   Also, rec she make a f/up with her pcp and f/up with them as instructed or sooner if concerns arise.  8. At risk for  deficient intake of food Jonee was given approximately 26 minutes of deficit intake of food prevention counseling today. Henry is at risk for eating too few calories based on current food recall. She was encouraged to focus on meeting caloric and protein goals according to her recommended meal plan.    Danira skips breakfast and is unsure if she will eat the foods on the plan despite her convictions.    -I had a long discussion with her on the importance of various nutrients in proper amounts in her body in order for her to lose weight.   9. Class 1 obesity with serious comorbidity and body mass index (BMI) of 34.0 to 34.9 in adult, unspecified obesity type  Corlis is currently in the action stage of change and her goal is to continue with  weight loss efforts. I recommend Dymphna begin the structured treatment plan as follows:  She has agreed to the Category 1 Plan.  Exercise goals: As is.   Behavioral modification strategies: keeping healthy foods in the home and planning for success.  She was informed of the importance of frequent follow-up visits to maximize her success with intensive lifestyle modifications for her multiple health conditions. She was informed we would discuss her lab results at her next visit unless there is a critical issue that needs to be addressed sooner. Reizel agreed to keep her next visit at the agreed upon time to discuss these results.  Objective:   Blood pressure 112/78, pulse 81, temperature 98.2 F (36.8 C), height 5\' 1"  (1.549 m), weight 182 lb (82.6 kg), last menstrual period 11/10/2011, SpO2 100 %. Body mass index is 34.39 kg/m.  EKG: Normal sinus rhythm, rate 78 bpm.  Indirect Calorimeter completed today shows a VO2 of 210 and a REE of 1464.  Her calculated basal metabolic rate is 3235 thus her basal metabolic rate is better than expected.  General: Cooperative, alert, well developed, in no acute distress. HEENT: Conjunctivae and lids  unremarkable. Cardiovascular: Regular rhythm.  Lungs: Normal work of breathing. Neurologic: No focal deficits.   Lab Results  Component Value Date   CREATININE 0.92 03/29/2020   BUN 12 03/29/2020   NA 142 03/29/2020   K 4.6 03/29/2020   CL 106 03/29/2020   CO2 24 03/29/2020   Lab Results  Component Value Date   ALT 17 03/29/2020   AST 24 03/29/2020   ALKPHOS 73 03/29/2020   BILITOT 0.4 03/29/2020   Lab Results  Component Value Date   HGBA1C 5.9 (H) 03/29/2020   HGBA1C 6.0 (H) 03/01/2020   HGBA1C 5.9 (H) 07/27/2019   HGBA1C 5.7 (H) 07/25/2018   Lab Results  Component Value Date   INSULIN 8.2 03/29/2020   Lab Results  Component Value Date   TSH 4.540 (H) 03/29/2020   Lab Results  Component Value Date   CHOL 191 03/29/2020   HDL 72 03/29/2020   LDLCALC 109 (H) 03/29/2020   TRIG 54 03/29/2020   CHOLHDL 2.7 03/29/2020   Lab Results  Component Value Date   WBC 5.1 03/29/2020   HGB 14.4 03/29/2020   HCT 43.6 03/29/2020   MCV 88 03/29/2020   PLT 180 03/29/2020   Attestation Statements:   Reviewed by clinician on day of visit: allergies, medications, problem list, medical history, surgical history, family history, social history, and previous encounter notes.  I, Water quality scientist, CMA, am acting as Location manager for [employer], DO.  I have reviewed the above documentation for accuracy and completeness, and I agree with the above. Marjory Sneddon, D.O.  The [place] was signed into law in 2016 which includes the topic of electronic health records.  This provides immediate access to information in MyChart.  This includes consultation notes, operative notes, office notes, lab results and pathology reports.  If you have any questions about what you read please let us know at your next visit so we can discuss your concerns and take corrective action if need be.  We are right here with you.

## 2020-04-12 ENCOUNTER — Encounter (INDEPENDENT_AMBULATORY_CARE_PROVIDER_SITE_OTHER): Payer: Self-pay | Admitting: Family Medicine

## 2020-04-12 ENCOUNTER — Ambulatory Visit (INDEPENDENT_AMBULATORY_CARE_PROVIDER_SITE_OTHER): Payer: 59 | Admitting: Family Medicine

## 2020-04-12 ENCOUNTER — Other Ambulatory Visit: Payer: Self-pay

## 2020-04-12 VITALS — BP 118/73 | HR 87 | Temp 98.0°F | Ht 61.0 in | Wt 182.0 lb

## 2020-04-12 DIAGNOSIS — E038 Other specified hypothyroidism: Secondary | ICD-10-CM | POA: Insufficient documentation

## 2020-04-12 DIAGNOSIS — E7849 Other hyperlipidemia: Secondary | ICD-10-CM | POA: Insufficient documentation

## 2020-04-12 DIAGNOSIS — E559 Vitamin D deficiency, unspecified: Secondary | ICD-10-CM | POA: Diagnosis not present

## 2020-04-12 DIAGNOSIS — Z9189 Other specified personal risk factors, not elsewhere classified: Secondary | ICD-10-CM | POA: Diagnosis not present

## 2020-04-12 DIAGNOSIS — Z6834 Body mass index (BMI) 34.0-34.9, adult: Secondary | ICD-10-CM

## 2020-04-12 DIAGNOSIS — K588 Other irritable bowel syndrome: Secondary | ICD-10-CM | POA: Diagnosis not present

## 2020-04-12 DIAGNOSIS — E669 Obesity, unspecified: Secondary | ICD-10-CM

## 2020-04-12 DIAGNOSIS — R7303 Prediabetes: Secondary | ICD-10-CM | POA: Diagnosis not present

## 2020-04-12 MED ORDER — VITAMIN D (ERGOCALCIFEROL) 1.25 MG (50000 UNIT) PO CAPS: 50000.0000 [IU] | ORAL_CAPSULE | ORAL | 0 refills | Status: DC

## 2020-04-13 NOTE — Progress Notes (Signed)
Chief Complaint:   OBESITY Tanya Weber is here to discuss her progress with her obesity treatment plan along with follow-up of her obesity related diagnoses. Tanya Weber is on the Category 1 Plan and states she is following her eating plan approximately 20% of the time. [name] states she is exercising for 0 minutes 0 times per week.  Today's visit was #: 2 Starting weight: 182 lbs Starting date: 03/29/2020 Today's weight: 182 lbs Today's date: 04/12/2020 Total lbs lost to date: 0 Total lbs lost since last in-office visit: 0  Interim History: Tanya Weber just got back from [place].  She says she, "didn't really have time to follow it".  When she returned to the states, she got food and followed the plan for 2 days.  She says it was not difficult to get the food.  Her husband does not need to lose weight.  She shops for groceries on Friday or Saturday.  Assessment/Plan:   1. Prediabetes Discussed labs with patient today.  Her sister and mother were diagnosed with diabetes in the 41s.  Plan:  Decrease simple carbs, increase complex carbs, increase exercise, will continue to monitor every 3-4 months.  Lab Results  Component Value Date   HGBA1C 5.9 (H) 03/29/2020   Lab Results  Component Value Date   INSULIN 8.2 03/29/2020   2. Subclinical hypothyroidism Discussed labs with patient today.  Slightly elevated TSH.  Other indices within normal limits.  Plan:  Recheck labs for stability every 6-12 months or sooner if she develops concerns.  Counseling done regarding subclinical hypothyroidism.  Lab Results  Component Value Date   TSH 4.540 (H) 03/29/2020   3. Other irritable bowel syndrome Discussed labs with patient today.  She is lactose intolerant.  Plan:  Continue treatment plan per GI/PCP.  Continue lactose free meal plan.  4. Vitamin D deficiency Discussed labs with patient today.  Tanya Weber takes vitamin D 5,000 IU daily.   Plan:  She will take 10,000 IU OTC daily until supplement is  gon and then start vitamin D 50,000 IU weekly, as per below.  Recheck levels in 3-4 months.  -Start Vitamin D, Ergocalciferol, (DRISDOL) 1.25 MG (50000 UNIT) CAPS capsule; Take 1 capsule (50,000 Units total) by mouth every 7 (seven) days.  Dispense: 4 capsule; Refill: 0  5. Other hyperlipidemia Discussed labs with patient today.  Slightly elevated LDL, but HDL 70+.    Plan:  Continue exercise, follow prudent nutritional plan, weight loss, and will continue to monitor.  Recheck labs in 3-4 months.  Lab Results  Component Value Date   ALT 17 03/29/2020   AST 24 03/29/2020   ALKPHOS 73 03/29/2020   BILITOT 0.4 03/29/2020   Lab Results  Component Value Date   CHOL 191 03/29/2020   HDL 72 03/29/2020   LDLCALC 109 (H) 03/29/2020   TRIG 54 03/29/2020   CHOLHDL 2.7 03/29/2020   6. At risk for heart disease Due to [name]'s current state of health and medical condition(s), she is at a higher risk for heart disease.   This puts the patient at much greater risk to subsequently develop cardiopulmonary conditions that can significantly affect patient's quality of life in a negative manner as well.    At least 28 minutes was spent on counseling [name] about these concerns today and I stressed the importance of reversing risks factors of obesity, esp truncal and visceral fat, hypertension, hyperlipidemia, pre-diabetes.   Initial goal is to lose at least 5-10% of starting weight to  help reduce these risk factors.   Counseling: Intensive lifestyle modifications discussed with Tanya Weber as most appropriate first line treatment.  she will continue to work on diet, exercise and weight loss efforts.  We will continue to reassess these conditions on a fairly regular basis in an attempt to decrease patient's overall morbidity and mortality.  Evidence-based interventions for health behavior change were utilized today including the discussion of self monitoring techniques, problem-solving barriers and SMART goal  setting techniques.  Specifically regarding patient's less desirable eating habits and patterns, we employed the technique of small changes when Tanya Weber has not been able to fully commit to her prudent nutritional plan.  7. Class 1 obesity with serious comorbidity and body mass index (BMI) of 34.0 to 34.9 in adult, unspecified obesity type  [name] is currently in the action stage of change. As such, her goal is to continue with weight loss efforts. She has agreed to the Category 1 Plan.   Exercise goals: For substantial health benefits, adults should do at least 150 minutes (2 hours and 30 minutes) a week of moderate-intensity, or 75 minutes (1 hour and 15 minutes) a week of vigorous-intensity aerobic physical activity, or an equivalent combination of moderate- and vigorous-intensity aerobic activity. Aerobic activity should be performed in episodes of at least 10 minutes, and preferably, it should be spread throughout the week.  Behavioral modification strategies: decreasing simple carbohydrates, meal planning and cooking strategies, keeping healthy foods in the home and travel eating strategies, and eating out strategies.  Temperance has agreed to follow-up with our clinic in 2 weeks. She was informed of the importance of frequent follow-up visits to maximize her success with intensive lifestyle modifications for her multiple health conditions.   Objective:   Blood pressure 118/73, pulse 87, temperature 98 F (36.7 C), height 5\' 1"  (1.549 m), weight 182 lb (82.6 kg), last menstrual period 11/10/2011, SpO2 99 %. Body mass index is 34.39 kg/m.  General: Cooperative, alert, well developed, in no acute distress. HEENT: Conjunctivae and lids unremarkable. Cardiovascular: Regular rhythm.  Lungs: Normal work of breathing. Neurologic: No focal deficits.   Lab Results  Component Value Date   CREATININE 0.92 03/29/2020   BUN 12 03/29/2020   NA 142 03/29/2020   K 4.6 03/29/2020   CL 106 03/29/2020     CO2 24 03/29/2020   Lab Results  Component Value Date   ALT 17 03/29/2020   AST 24 03/29/2020   ALKPHOS 73 03/29/2020   BILITOT 0.4 03/29/2020   Lab Results  Component Value Date   HGBA1C 5.9 (H) 03/29/2020   HGBA1C 6.0 (H) 03/01/2020   HGBA1C 5.9 (H) 07/27/2019   HGBA1C 5.7 (H) 07/25/2018   Lab Results  Component Value Date   INSULIN 8.2 03/29/2020   Lab Results  Component Value Date   TSH 4.540 (H) 03/29/2020   Lab Results  Component Value Date   CHOL 191 03/29/2020   HDL 72 03/29/2020   LDLCALC 109 (H) 03/29/2020   TRIG 54 03/29/2020   CHOLHDL 2.7 03/29/2020   Lab Results  Component Value Date   WBC 5.1 03/29/2020   HGB 14.4 03/29/2020   HCT 43.6 03/29/2020   MCV 88 03/29/2020   PLT 180 03/29/2020   Attestation Statements:   Reviewed by clinician on day of visit: allergies, medications, problem list, medical history, surgical history, family history, social history, and previous encounter notes.  I, Water quality scientist, CMA, am acting as Location manager for [employer], DO.  I have reviewed  the above documentation for accuracy and completeness, and I agree with the above. Marjory Sneddon, D.O.  The [place] was signed into law in 2016 which includes the topic of electronic health records.  This provides immediate access to information in MyChart.  This includes consultation notes, operative notes, office notes, lab results and pathology reports.  If you have any questions about what you read please let us know at your next visit so we can discuss your concerns and take corrective action if need be.  We are right here with you.

## 2020-04-27 ENCOUNTER — Ambulatory Visit (INDEPENDENT_AMBULATORY_CARE_PROVIDER_SITE_OTHER): Payer: 59 | Admitting: Family Medicine

## 2020-06-06 ENCOUNTER — Other Ambulatory Visit: Payer: Self-pay | Admitting: Obstetrics and Gynecology

## 2020-06-06 DIAGNOSIS — Z1231 Encounter for screening mammogram for malignant neoplasm of breast: Secondary | ICD-10-CM

## 2020-06-13 ENCOUNTER — Ambulatory Visit (INDEPENDENT_AMBULATORY_CARE_PROVIDER_SITE_OTHER): Payer: 59 | Admitting: Family Medicine

## 2020-07-14 ENCOUNTER — Ambulatory Visit: Payer: Self-pay

## 2020-07-14 ENCOUNTER — Other Ambulatory Visit: Payer: Self-pay

## 2020-07-14 ENCOUNTER — Ambulatory Visit
Admission: RE | Admit: 2020-07-14 | Discharge: 2020-07-14 | Disposition: A | Discharge status: Home / Self Care | Payer: 59 | Source: Ambulatory Visit | Attending: Obstetrics and Gynecology | Admitting: Obstetrics and Gynecology

## 2020-07-14 DIAGNOSIS — Z1231 Encounter for screening mammogram for malignant neoplasm of breast: Secondary | ICD-10-CM

## 2020-08-30 NOTE — Progress Notes (Signed)
65 y.o. [id number] Married [place] American female here for annual exam.    Not going to the [facility] anymore. Wants to focus on wellness.  Husband may have a dx of Lewy Body Dementia. Patient is experiencing stress and jaw pain.  Seeing her dentist.   Patient working full time from home.  PCP:  London Pepper, MD   Patient's last menstrual period was 11/10/2011.           Sexually active: Yes.    The current method of family planning is vasectomy/PMP.    Exercising: Yes.    aerobics and weights 3-4x/week Smoker:  no  Health Maintenance: Pap: 07-25-18 Neg:Neg HR HPV,07-19-17 Neg:Neg HR HPV, 07-06-16 Neg:Neg HR HPV  History of abnormal Pap:  Yes, 06-15-15 LGSIL:Neg HR HPV MMG: 07-14-20 3D/Neg/BiRads1 Colonoscopy: cologuard 2018 normal per patient;2008 neg.colonoscopy.   BMD:   n/a  Result  n/a TDaP:  11-17-16 Gardasil:   no HIV:07-06-16 NR Hep C:07-06-16 Neg Screening Labs:  PCP.   reports that she has never smoked. She has never used smokeless tobacco. She reports that she does not drink alcohol and does not use drugs.  Past Medical History:  Diagnosis Date  . Anxiety   . Depression   . Elevated hemoglobin A1c   . Elevated testosterone level in female 10/2017   normal free testosterone  . Elevated TSH 2021   Normal free T4  . Environmental allergies   . Fibroids   . Gallbladder problem   . GERD (gastroesophageal reflux disease)   . History of postpartum depression 1987  . Hypermagnesemia 2019  . IBS (irritable bowel syndrome)   . Joint pain   . Lactose intolerance   . Obesity   . Prediabetes   . Vitamin D deficiency     Past Surgical History:  Procedure Laterality Date  . CESAREAN SECTION    . CHOLECYSTECTOMY N/A 03/02/2019   Procedure: LAPAROSCOPIC CHOLECYSTECTOMY;  Surgeon: Coralie Keens, MD;  Location: [facility];  Service: General;  Laterality: N/A;    Current Outpatient Medications  Medication Sig Dispense Refill  . Cholecalciferol (VITAMIN D3) 125 MCG  (5000 UT) CAPS Take 1 capsule by mouth daily.    . diclofenac Sodium (VOLTAREN) 1 % GEL Apply topically 4 (four) times daily.    . Multiple Vitamin (MULTIVITAMIN) capsule Take 1 capsule by mouth daily.    . Omega-3 Fatty Acids (FISH OIL) 1000 MG CAPS Take 1 capsule by mouth daily.    . Camphor-Eucalyptus-Menthol (VICKS VAPORUB EX) Apply 1 application topically daily as needed (congestion).    . Vitamin D, Ergocalciferol, (DRISDOL) 1.25 MG (50000 UNIT) CAPS capsule Take 1 capsule (50,000 Units total) by mouth every 7 (seven) days. (Patient not taking: Reported on 08/31/2020) 4 capsule 0   No current facility-administered medications for this visit.    Family History  Problem Relation Age of Onset  . Heart attack Father 67       deceased  . Diabetes Brother 22       IDDM  . Diabetes Mother   . Obesity Mother   . Hypertension Sister   . Breast cancer Neg Hx     Review of Systems  All other systems reviewed and are negative.   Exam:   BP 118/70 (Cuff Size: Large)   Pulse 85   Ht 5' 1.5" (1.562 m)   Wt 181 lb (82.1 kg)   LMP 11/10/2011   SpO2 99%   BMI 33.65 kg/m     General  appearance: alert, cooperative and appears stated age Head: normocephalic, without obvious abnormality, atraumatic Neck: no adenopathy, supple, symmetrical, trachea midline and thyroid normal to inspection and palpation Lungs: clear to auscultation bilaterally Breasts: normal appearance, no masses or tenderness, No nipple retraction or dimpling, No nipple discharge or bleeding, No axillary adenopathy Heart: regular rate and rhythm Abdomen: soft, non-tender; no masses, no organomegaly Extremities: extremities normal, atraumatic, no cyanosis or edema Skin: skin color, texture, turgor normal. No rashes or lesions Lymph nodes: cervical, supraclavicular, and axillary nodes normal. Neurologic: grossly normal  Pelvic: External genitalia:  no lesions              No abnormal inguinal nodes palpated.               Urethra:  normal appearing urethra with no masses, tenderness or lesions              Bartholins and Skenes: normal                 Vagina: normal appearing vagina with normal color and discharge, no lesions              Cervix: no lesions              Pap taken: No. Bimanual Exam:  Uterus:  normal size, contour, position, consistency, mobility, non-tender              Adnexa: no mass, fullness, tenderness              Rectal exam: Yes.  .  Confirms.              Anus:  normal sphincter tone, no lesions  Chaperone was present for exam.  Assessment:   Well woman visit with normal exam. Hx LGSIL.  Hx fibroids.  Colon cancer screening.  Family health concern.  Plan: Mammogram screening discussed. Self breast awareness reviewed. Pap and HR HPV as above. Guidelines for Calcium, Vitamin D, regular exercise program including cardiovascular and weight bearing exercise. Cologuard will be ordered. Support and direction given for the care of her husband.  She will see her PCP this year for a routine exam and labs. Follow up annually and prn.

## 2020-08-31 ENCOUNTER — Encounter: Payer: Self-pay | Admitting: Obstetrics and Gynecology

## 2020-08-31 ENCOUNTER — Other Ambulatory Visit: Payer: Self-pay

## 2020-08-31 ENCOUNTER — Ambulatory Visit (INDEPENDENT_AMBULATORY_CARE_PROVIDER_SITE_OTHER): Payer: 59 | Admitting: Obstetrics and Gynecology

## 2020-08-31 VITALS — BP 118/70 | HR 85 | Ht 61.5 in | Wt 181.0 lb

## 2020-08-31 DIAGNOSIS — Z01419 Encounter for gynecological examination (general) (routine) without abnormal findings: Secondary | ICD-10-CM | POA: Diagnosis not present

## 2020-08-31 DIAGNOSIS — Z1211 Encounter for screening for malignant neoplasm of colon: Secondary | ICD-10-CM | POA: Diagnosis not present

## 2020-08-31 NOTE — Patient Instructions (Signed)

## 2020-09-16 ENCOUNTER — Other Ambulatory Visit: Payer: Self-pay | Admitting: Obstetrics and Gynecology

## 2020-09-16 DIAGNOSIS — Z1231 Encounter for screening mammogram for malignant neoplasm of breast: Secondary | ICD-10-CM

## 2020-09-19 LAB — COLOGUARD: Cologuard: NEGATIVE

## 2020-09-28 ENCOUNTER — Telehealth: Payer: Self-pay | Admitting: Obstetrics and Gynecology

## 2020-09-28 NOTE — Telephone Encounter (Signed)
Please contact patient to let her know that her Cologuard specimen could not be processed.   The collection kit was damaged prior to receipt in the laboratory.    She will be contacted by the company to initiate a new sample collection.

## 2020-09-29 ENCOUNTER — Encounter: Payer: Self-pay | Admitting: Obstetrics and Gynecology

## 2020-09-29 NOTE — Telephone Encounter (Signed)
Patient aware, reports the company already reached out to her.

## 2020-09-30 LAB — COLOGUARD: Cologuard: NEGATIVE

## 2020-10-18 ENCOUNTER — Telehealth: Payer: Self-pay | Admitting: Obstetrics and Gynecology

## 2020-10-18 NOTE — Telephone Encounter (Signed)
Please let patient know that I received a notice from [employer] regarding her Cologuard specimen collected on 09/29/20.   There was insufficient stool DNA detected to produce a valid Cologuard result.  The company will contact the patient to initiate a new sample collection.

## 2020-10-19 NOTE — Telephone Encounter (Signed)
Patient informed with below note. 

## 2021-09-01 ENCOUNTER — Ambulatory Visit (INDEPENDENT_AMBULATORY_CARE_PROVIDER_SITE_OTHER): Payer: No Typology Code available for payment source | Admitting: Family Medicine

## 2021-09-01 VITALS — BP 118/80 | Ht 62.5 in | Wt 180.0 lb

## 2021-09-01 DIAGNOSIS — M79671 Pain in right foot: Secondary | ICD-10-CM

## 2021-09-01 DIAGNOSIS — M2141 Flat foot [pes planus] (acquired), right foot: Secondary | ICD-10-CM | POA: Diagnosis not present

## 2021-09-01 DIAGNOSIS — M2142 Flat foot [pes planus] (acquired), left foot: Secondary | ICD-10-CM | POA: Diagnosis not present

## 2021-09-01 NOTE — Progress Notes (Signed)
[facility]: Attending Note: I have reviewed the chart, discussed wit the Sports Medicine Fellow. I agree with assessment and treatment plan as detailed in the Fellow's note.  

## 2021-09-01 NOTE — Progress Notes (Signed)
Subjective: Tanya Weber is a pleasant 66 year old female who presents after referral from Dr. Inez Catalina for custom orthotics.  She had been giving green sports insoles with scaphoid pads that did give her some relief.  She also states she has right knee pain and is interested in the body helix sleeves today. ? ?Patient was fitted for a: standard, cushioned, semi-rigid orthotic. ?The orthotic was heated and afterward the patient stood on the orthotic blank positioned on the orthotic stand. ?The patient was positioned in subtalar neutral position and 10 degrees of ankle dorsiflexion in a weight bearing stance. ?After completion of molding, a stable base was applied to the orthotic blank. ?The blank was ground to a stable position for weight bearing. ?Size: 7 ?Base: blue EVA ?Posting: none ?Additional orthotic padding: none ? ?Initially patient felt like the orthotics raised her out of her shoes, we did gently shave these down and I showed her how to perform a heel lock for her shoes which she found to be much more comfortable. ? ?She also wished to purchase a body helix knee sleeve for the right knee as well today. ? ?Total time spent with the patient was 30 minutes with greater than 50% of the time spent in face-to-face consultation discussing orthotic construction, instruction, and sitting. Gait was neutral with orthotics in place. Patient found them to be comfortable. Follow-up as needed. ? ?Elba Barman, DO ?PGY-4, Sports Medicine Fellow ?[place] ? ?This note was dictated using Dragon naturally speaking software and may contain errors in syntax, spelling, or content which have not been identified prior to signing this note.  ? ? ?

## 2021-09-06 ENCOUNTER — Ambulatory Visit: Payer: 59 | Admitting: Obstetrics and Gynecology

## 2021-09-06 ENCOUNTER — Other Ambulatory Visit: Payer: Self-pay

## 2021-09-06 ENCOUNTER — Ambulatory Visit
Admission: RE | Admit: 2021-09-06 | Discharge: 2021-09-06 | Disposition: A | Discharge status: Home / Self Care | Payer: No Typology Code available for payment source | Source: Ambulatory Visit | Attending: Obstetrics and Gynecology | Admitting: Obstetrics and Gynecology

## 2021-09-06 DIAGNOSIS — Z1231 Encounter for screening mammogram for malignant neoplasm of breast: Secondary | ICD-10-CM

## 2021-10-09 DIAGNOSIS — M8580 Other specified disorders of bone density and structure, unspecified site: Secondary | ICD-10-CM

## 2021-10-09 HISTORY — DX: Other specified disorders of bone density and structure, unspecified site: M85.80

## 2021-10-09 NOTE — Progress Notes (Signed)
66 y.o. [id number] Married [place] American female here for annual exam.   ? ?No concerns.  ? ?Injured her right foot.  ? ?Will be working part time.  ?Cares for her husband at home.  ? ?PCP:   London Pepper, MD ? ?Patient's last menstrual period was 11/10/2011.     ?  ?    ?Sexually active: Yes.    ?The current method of family planning is vasectomy.    ?Exercising: Yes.     walk ?Smoker:  no ? ?Health Maintenance: ?Pap:  07-25-18 Normal Neg HR HPV,07-19-17 Normal Neg HR HPV ?History of abnormal Pap:  yes 06-15-15 LGSIL ?MMG:  09-06-21 normal Bi RADS 1 Cat.A  ?Colonoscopy:  2008 normal.  States she has appointment next week with GI.  ?VCB:SWHQ yrs  Result Pt. States Normal  ?TDaP:  2018 ?Gardasil:   no ?HIV:NR ?Hep C:Neg ?Screening Labs:  Hb today: PCP, Urine today: PCP ? ? reports that she has never smoked. She has never used smokeless tobacco. She reports that she does not drink alcohol and does not use drugs. ? ?Past Medical History:  ?Diagnosis Date  ? Anxiety   ? Depression   ? Elevated hemoglobin A1c   ? Elevated testosterone level in female 10/2017  ? normal free testosterone  ? Elevated TSH 2021  ? Normal free T4  ? Environmental allergies   ? Fibroids   ? Gallbladder problem   ? GERD (gastroesophageal reflux disease)   ? History of postpartum depression 1987  ? Hypermagnesemia 2019  ? IBS (irritable bowel syndrome)   ? Joint pain   ? Lactose intolerance   ? Obesity   ? Prediabetes   ? Vitamin D deficiency   ? ? ?Past Surgical History:  ?Procedure Laterality Date  ? CESAREAN SECTION    ? CHOLECYSTECTOMY N/A 03/02/2019  ? Procedure: LAPAROSCOPIC CHOLECYSTECTOMY;  Surgeon: Coralie Keens, MD;  Location: [facility];  Service: General;  Laterality: N/A;  ? ? ?Current Outpatient Medications  ?Medication Sig Dispense Refill  ? Camphor-Eucalyptus-Menthol (VICKS VAPORUB EX) Apply 1 application topically daily as needed (congestion).    ? Cholecalciferol (VITAMIN D3) 125 MCG (5000 UT) CAPS Take 1 capsule by mouth daily.    ?  diclofenac Sodium (VOLTAREN) 1 % GEL Apply topically 4 (four) times daily.    ? Multiple Vitamin (MULTIVITAMIN) capsule Take 1 capsule by mouth daily.    ? Omega-3 Fatty Acids (FISH OIL) 1000 MG CAPS Take 1 capsule by mouth daily.    ? Vitamin D, Ergocalciferol, (DRISDOL) 1.25 MG (50000 UNIT) CAPS capsule Take 1 capsule (50,000 Units total) by mouth every 7 (seven) days. (Patient not taking: Reported on 08/31/2020) 4 capsule 0  ? ?No current facility-administered medications for this visit.  ? ? ?Family History  ?Problem Relation Age of Onset  ? Diabetes Mother   ? Obesity Mother   ? Heart attack Father 26  ?     deceased  ? Hypertension Sister   ? Breast cancer Cousin 12  ? Diabetes Brother 22  ?     IDDM  ? ? ?Review of Systems  ?All other systems reviewed and are negative. ? ?Exam:   ?LMP 11/10/2011     ?General appearance: alert, cooperative and appears stated age ?Head: normocephalic, without obvious abnormality, atraumatic ?Neck: no adenopathy, supple, symmetrical, trachea midline and thyroid normal to inspection and palpation ?Lungs: clear to auscultation bilaterally ?Breasts: normal appearance, no masses or tenderness, No nipple retraction or dimpling, No nipple discharge  or bleeding, No axillary adenopathy ?Heart: regular rate and rhythm ?Abdomen: soft, non-tender; no masses, no organomegaly ?Extremities: extremities normal, atraumatic, no cyanosis or edema ?Skin: skin color, texture, turgor normal. No rashes or lesions ?Lymph nodes: cervical, supraclavicular, and axillary nodes normal. ?Neurologic: grossly normal ? ?Pelvic: External genitalia:  no lesions ?             No abnormal inguinal nodes palpated. ?             Urethra:  normal appearing urethra with no masses, tenderness or lesions ?             Bartholins and Skenes: normal    ?             Vagina: normal appearing vagina with normal color and discharge, no lesions ?             Cervix: no lesions ?             Pap taken: yes ?Bimanual Exam:   Uterus:  normal size, contour, position, consistency, mobility, non-tender ?             Adnexa: no mass, fullness, tenderness ?             Rectal exam: yes.  Confirms. ?             Anus:  normal sphincter tone, no lesions ? ?Chaperone was present for exam:  Maudie Mercury, CMA ? ?Assessment:   ?Well woman visit with gynecologic exam. ?Hx LGSIL.  ?Cervical cancer screening. ?Hx fibroids.  ?Colon cancer screening.  ?Osteoporosis screening.  ? ?Plan: ?Mammogram screening discussed. ?Self breast awareness reviewed. ?Pap and HR HPV collected. ?Guidelines for Calcium, Vitamin D, regular exercise program including cardiovascular and weight bearing exercise. ?Labs with PCP.  ?BMD ordered at the [facility].  ?Follow up annually and prn.  ? ?After visit summary provided.  ? ? ?  ?

## 2021-10-20 ENCOUNTER — Ambulatory Visit (INDEPENDENT_AMBULATORY_CARE_PROVIDER_SITE_OTHER): Payer: No Typology Code available for payment source | Admitting: Obstetrics and Gynecology

## 2021-10-20 ENCOUNTER — Encounter: Payer: Self-pay | Admitting: Obstetrics and Gynecology

## 2021-10-20 ENCOUNTER — Other Ambulatory Visit (HOSPITAL_COMMUNITY)
Admission: RE | Admit: 2021-10-20 | Discharge: 2021-10-20 | Disposition: A | Discharge status: Home / Self Care | Payer: No Typology Code available for payment source | Source: Ambulatory Visit | Attending: Obstetrics and Gynecology | Admitting: Obstetrics and Gynecology

## 2021-10-20 VITALS — BP 120/78 | HR 84 | Ht 61.0 in | Wt 188.0 lb

## 2021-10-20 DIAGNOSIS — Z01419 Encounter for gynecological examination (general) (routine) without abnormal findings: Secondary | ICD-10-CM | POA: Diagnosis not present

## 2021-10-20 DIAGNOSIS — Z124 Encounter for screening for malignant neoplasm of cervix: Secondary | ICD-10-CM | POA: Diagnosis not present

## 2021-10-20 DIAGNOSIS — Z13820 Encounter for screening for osteoporosis: Secondary | ICD-10-CM | POA: Diagnosis not present

## 2021-10-20 DIAGNOSIS — Z1211 Encounter for screening for malignant neoplasm of colon: Secondary | ICD-10-CM

## 2021-10-20 NOTE — Patient Instructions (Signed)
EXERCISE AND DIET:  We recommended that you start or continue a regular exercise program for good health. Regular exercise means any activity that makes your heart beat faster and makes you sweat.  We recommend exercising at least 30 minutes per day at least 3 days a week, preferably 4 or 5.  We also recommend a diet low in fat and sugar.  Inactivity, poor dietary choices and obesity can cause diabetes, heart attack, stroke, and kidney damage, among others.   ? ?ALCOHOL AND SMOKING:  Women should limit their alcohol intake to no more than 7 drinks/beers/glasses of wine (combined, not each!) per week. Moderation of alcohol intake to this level decreases your risk of breast cancer and liver damage. And of course, no recreational drugs are part of a healthy lifestyle.  And absolutely no smoking or even second hand smoke. Most people know smoking can cause heart and lung diseases, but did you know it also contributes to weakening of your bones? Aging of your skin?  Yellowing of your teeth and nails? ? ?CALCIUM AND VITAMIN D:  Adequate intake of calcium and Vitamin D are recommended.  The recommendations for exact amounts of these supplements seem to change often, but generally speaking 600 mg of calcium (either carbonate or citrate) and 800 units of Vitamin D per day seems prudent. Certain women may benefit from higher intake of Vitamin D.  If you are among these women, your doctor will have told you during your visit.   ? ?PAP SMEARS:  Pap smears, to check for cervical cancer or precancers,  have traditionally been done yearly, although recent scientific advances have shown that most women can have pap smears less often.  However, every woman still should have a physical exam from her gynecologist every year. It will include a breast check, inspection of the vulva and vagina to check for abnormal growths or skin changes, a visual exam of the cervix, and then an exam to evaluate the size and shape of the uterus and  ovaries.  And after 65 years of age, a rectal exam is indicated to check for rectal cancers. We will also provide age appropriate advice regarding health maintenance, like when you should have certain vaccines, screening for sexually transmitted diseases, bone density testing, colonoscopy, mammograms, etc.  ? ?MAMMOGRAMS:  All women over 65 years old should have a yearly mammogram. Many facilities now offer a "3D" mammogram, which may cost around $50 extra out of pocket. If possible,  we recommend you accept the option to have the 3D mammogram performed.  It both reduces the number of women who will be called back for extra views which then turn out to be normal, and it is better than the routine mammogram at detecting truly abnormal areas.   ? ?COLONOSCOPY:  Colonoscopy to screen for colon cancer is recommended for all women at age 65.  We know, you hate the idea of the prep.  We agree, BUT, having colon cancer and not knowing it is worse!!  Colon cancer so often starts as a polyp that can be seen and removed at colonscopy, which can quite literally save your life!  And if your first colonoscopy is normal and you have no family history of colon cancer, most women don't have to have it again for 10 years.  Once every ten years, you can do something that may end up saving your life, right?  We will be happy to help you get it scheduled when you are ready.  Be sure to check your insurance coverage so you understand how much it will cost.  It may be covered as a preventative service at no cost, but you should check your particular policy.   ? ?Calcium Content in Foods ?Calcium is the most abundant mineral in the body. Most of the body's calcium supply is stored in bones and teeth. Calcium helps many parts of the body function normally, including: ?Blood and blood vessels. ?Nerves. ?Hormones. ?Muscles. ?Bones and teeth. ?When your calcium stores are low, you may be at risk for low bone mass, bone loss, and broken bones  (fractures). When you get enough calcium, it helps to support strong bones and teeth throughout your life. ?Calcium is especially important for: ?Children during growth spurts. ?Girls during adolescence. ?Women who are pregnant or breastfeeding. ?Women after their menstrual cycle stops (postmenopause). ?Women whose menstrual cycle has stopped due to anorexia nervosa or regular intense exercise. ?People who cannot eat or digest dairy products. ?Vegans. ?Recommended daily amounts of calcium: ?Women (ages 65 to 70): 1,000 mg per day. ?Women (ages 75 and older): 1,200 mg per day. ?Men (ages 76 to 6): 1,000 mg per day. ?Men (ages 65 and older): 1,200 mg per day. ?Women (ages 65 to 52): 1,300 mg per day. ?Men (ages 74 to 65): 1,300 mg per day. ?General information ?Eat foods that are high in calcium. Try to get most of your calcium from food. ?Some people may benefit from taking calcium supplements. Check with your health care provider or diet and nutrition specialist (dietitian) before starting any calcium supplements. Calcium supplements may interact with certain medicines. Too much calcium may cause other health problems, such as constipation and kidney stones. ?For the body to absorb calcium, it needs vitamin D. Sources of vitamin D include: ?Skin exposure to direct sunlight. ?Foods, such as egg yolks, liver, mushrooms, saltwater fish, and fortified milk. ?Vitamin D supplements. Check with your health care provider or dietitian before starting any vitamin D supplements. ?What foods are high in calcium? ? ?Foods that are high in calcium contain more than 100 milligrams per serving. ?Fruits ?Fortified orange juice or other fruit juice, 300 mg per 8 oz serving. ?Vegetables ?Collard greens, 360 mg per 8 oz serving. ?Kale, 100 mg per 8 oz serving. ?Bok choy, 160 mg per 8 oz serving. ?Grains ?Fortified ready-to-eat cereals, 100 to 1,000 mg per 8 oz serving. ?Fortified frozen waffles, 200 mg in 2 waffles. ?Oatmeal, 140 mg in  1 cup. ?Meats and other proteins ?Sardines, canned with bones, 325 mg per 3 oz serving. ?Salmon, canned with bones, 180 mg per 3 oz serving. ?Canned shrimp, 125 mg per 3 oz serving. ?Baked beans, 160 mg per 4 oz serving. ?Tofu, firm, made with calcium sulfate, 253 mg per 4 oz serving. ?Dairy ?Yogurt, plain, low-fat, 310 mg per 6 oz serving. ?Nonfat milk, 300 mg per 8 oz serving. ?American cheese, 195 mg per 1 oz serving. ?Cheddar cheese, 205 mg per 1 oz serving. ?Cottage cheese 2%, 105 mg per 4 oz serving. ?Fortified soy, rice, or almond milk, 300 mg per 8 oz serving. ?Mozzarella, part skim, 210 mg per 1 oz serving. ?The items listed above may not be a complete list of foods high in calcium. Actual amounts of calcium may be different depending on processing. Contact a dietitian for more information. ?What foods are lower in calcium? ?Foods that are lower in calcium contain 50 mg or less per serving. ?Fruits ?Apple, about 6 mg. ?Banana, about 12 mg. ?  Vegetables ?Lettuce, 19 mg per 2 oz serving. ?Tomato, about 11 mg. ?Grains ?Rice, 4 mg per 6 oz serving. ?Boiled potatoes, 14 mg per 8 oz serving. ?White bread, 6 mg per slice. ?Meats and other proteins ?Egg, 27 mg per 2 oz serving. ?Red meat, 7 mg per 4 oz serving. ?Chicken, 17 mg per 4 oz serving. ?Fish, cod, or trout, 20 mg per 4 oz serving. ?Dairy ?Cream cheese, regular, 14 mg per 1 Tbsp serving. ?Brie cheese, 50 mg per 1 oz serving. ?Parmesan cheese, 70 mg per 1 Tbsp serving. ?The items listed above may not be a complete list of foods lower in calcium. Actual amounts of calcium may be different depending on processing. Contact a dietitian for more information. ?Summary ?Calcium is an important mineral in the body because it affects many functions. Getting enough calcium helps support strong bones and teeth throughout your life. ?Try to get most of your calcium from food. ?Calcium supplements may interact with certain medicines. Check with your health care provider  or dietitian before starting any calcium supplements. ?This information is not intended to replace advice given to you by your health care provider. Make sure you discuss any questions you have with your h

## 2021-10-25 LAB — CYTOLOGY - PAP
Comment: NEGATIVE
Diagnosis: NEGATIVE
High risk HPV: NEGATIVE

## 2021-10-26 ENCOUNTER — Ambulatory Visit
Admission: RE | Admit: 2021-10-26 | Discharge: 2021-10-26 | Disposition: A | Discharge status: Home / Self Care | Payer: No Typology Code available for payment source | Source: Ambulatory Visit | Attending: Obstetrics and Gynecology | Admitting: Obstetrics and Gynecology

## 2021-10-26 DIAGNOSIS — Z13820 Encounter for screening for osteoporosis: Secondary | ICD-10-CM

## 2021-10-29 ENCOUNTER — Encounter: Payer: Self-pay | Admitting: Obstetrics and Gynecology

## 2022-01-17 ENCOUNTER — Encounter (INDEPENDENT_AMBULATORY_CARE_PROVIDER_SITE_OTHER): Payer: Self-pay

## 2022-03-12 ENCOUNTER — Telehealth: Payer: Self-pay

## 2022-03-12 NOTE — Telephone Encounter (Signed)
NOTE ATTACHED TO CHART

## 2022-03-14 NOTE — Progress Notes (Addendum)
Cardiology Office Note:    Date:  03/15/2022   ID:  Tanya, Weber 08/06/1955, MRN [id number]  PCP:  London Pepper, MD  Cardiologist:  None   Referring MD: London Pepper, MD   Chief Complaint  Patient presents with   Irregular Heart Beat    Burn physician concerned about AV block    History of Present Illness:    Tanya Weber is a 66 y.o. female with a hx of who is referred for palpitations with EKG showing AV block by Dr. Orland Mustard.  Significant underlying medical problems include prediabetes, gastroesophageal reflux disease, hyperlipidemia, and family history of vascular disease.  The patient does admit to having rare palpitations that resolved without consequence.  This has been going on for years.  Never had syncope.  She is very active able to jog and exercise including weightlifting without chest discomfort or dyspnea.  Her husband has advancing dementia and when she is under a lot of stress she occasionally feels right precordial chest tightness especially when her husband becomes belligerent.  Rest helps to resolve the discomfort.  This weekend she worked out at her physical fitness facility and states that she ran around a basketball court 50 times and also does some weightlifting without any symptoms whatsoever.  This is her typical pattern of exercise.  Past Medical History:  Diagnosis Date   Anxiety    Depression    Elevated hemoglobin A1c    Elevated testosterone level in female 10/2017   normal free testosterone   Elevated TSH 2021   Normal free T4   Environmental allergies    Fibroids    Gallbladder problem    GERD (gastroesophageal reflux disease)    History of postpartum depression 1987   Hypermagnesemia 2019   IBS (irritable bowel syndrome)    Joint pain    Lactose intolerance    Obesity    Osteopenia 10/2021   spine and hip   Prediabetes    Vitamin D deficiency     Past Surgical History:  Procedure Laterality Date   CESAREAN SECTION      CHOLECYSTECTOMY N/A 03/02/2019   Procedure: LAPAROSCOPIC CHOLECYSTECTOMY;  Surgeon: Coralie Keens, MD;  Location: [facility];  Service: General;  Laterality: N/A;    Current Medications: Current Meds  Medication Sig   Acetaminophen (TYLENOL PO) Take 500 mg by mouth as needed.   Camphor-Eucalyptus-Menthol (VICKS VAPORUB EX) Apply 1 application topically daily as needed (congestion).   Cholecalciferol (VITAMIN D3) 125 MCG (5000 UT) CAPS Take 1 capsule by mouth daily.   diclofenac Sodium (VOLTAREN) 1 % GEL Apply topically 4 (four) times daily.   doxepin (SINEQUAN) 10 MG/ML solution 0.3 mg at bedtime.   Multiple Vitamin (MULTIVITAMIN) capsule Take 1 capsule by mouth daily.   Omega-3 Fatty Acids (FISH OIL) 1000 MG CAPS Take 1 capsule by mouth daily.     Allergies:   Patient has no known allergies.   Social History   Socioeconomic History   Marital status: Married    Spouse name: Carolynn Sayers   Number of children: Not on file   Years of education: Not on file   Highest education level: Not on file  Occupational History   Occupation: Designer, jewellery  Tobacco Use   Smoking status: Never   Smokeless tobacco: Never  Vaping Use   Vaping Use: Never used  Substance and Sexual Activity   Alcohol use: No    Alcohol/week: 0.0 standard drinks of alcohol   Drug use: No  Sexual activity: Yes    Partners: Male    Birth control/protection: Other-see comments, Post-menopausal    Comment: vasectomy  Other Topics Concern   Not on file  Social History Narrative   Not on file   Social Determinants of Health   Financial Resource Strain: Not on file  Food Insecurity: Not on file  Transportation Needs: Not on file  Physical Activity: Not on file  Stress: Not on file  Social Connections: Not on file     Family History: The patient's family history includes Breast cancer (age of onset: 50) in her cousin; Diabetes in her mother; Diabetes (age of onset: 77) in her brother; Heart attack (age of  onset: 67) in her father; Hypertension in her sister; Obesity in her mother.  ROS:   Please see the history of present illness.    Stress related to her husband's dementia and aggressive behavior at times.  All other systems reviewed and are negative.  EKGs/Labs/Other Studies Reviewed:    The following studies were reviewed today:   EKG:  EKG 03/29/2020 demonstrates, demonstrated normal sinus rhythm with left axis deviation and QS pattern V1 and V2.  A tracing performed today demonstrates sinus rhythm at 93 bpm, leftward axis, poor R wave progression V1 through V3 with possible QS pattern in V1 and V2.  Late entry from PCP Office: Able to review a copy of ECG that was done at office and led to referral revealing shows 2 degree AV block or blocked PAC's(less likely).  Recent Labs: No results found for requested labs within last 365 days.  Recent Lipid Panel    Component Value Date/Time   CHOL 191 03/29/2020 0829   TRIG 54 03/29/2020 0829   HDL 72 03/29/2020 0829   CHOLHDL 2.7 03/29/2020 0829   CHOLHDL 2.4 07/06/2016 1047   VLDL 11 07/06/2016 1047   LDLCALC 109 (H) 03/29/2020 0829    Physical Exam:    VS:  BP 106/74   Pulse 93   Ht '5\' 1"'  (1.549 m)   Wt 194 lb 6.4 oz (88.2 kg)   LMP 11/10/2011   SpO2 95%   BMI 36.73 kg/m     Wt Readings from Last 3 Encounters:  03/15/22 194 lb 6.4 oz (88.2 kg)  10/20/21 188 lb (85.3 kg)  09/01/21 180 lb (81.6 kg)     GEN: Overweight. No acute distress HEENT: Normal NECK: No JVD. LYMPHATICS: No lymphadenopathy CARDIAC: No murmur. RRR no gallop, or edema. VASCULAR:  Normal Pulses. No bruits. RESPIRATORY:  Clear to auscultation without rales, wheezing or rhonchi  ABDOMEN: Soft, non-tender, non-distended, No pulsatile mass, MUSCULOSKELETAL: No deformity  SKIN: Warm and dry NEUROLOGIC:  Alert and oriented x 3 PSYCHIATRIC:  Normal affect   ASSESSMENT:    1. Palpitations   2. Nonspecific abnormal electrocardiogram (ECG) (EKG)    3. Chest discomfort   4. Other hyperlipidemia   5. Prediabetes    PLAN:    In order of problems listed above:  The EKG from [facility] did not come with the patient.  We will request this.  She will also wear a 72-hour monitor to rule out any occurrences of AV block. Please see EKG interpretation above. Because of her home situation and the extreme stress and insomnia associated with managing her husband's advancing dementia and swing in moods, she is at risk for stress cardiomyopathy or other stress-induced cardiac issues.  When she is relaxed and exercising, she has no cardiac symptoms whatsoever.  We did discuss  this as a potential risk for her.  Unfortunately she has no respite from the requirement that she take care of her husband.  Will review data and determine if any further work-up needs to be done.   Medication Adjustments/Labs and Tests Ordered: Current medicines are reviewed at length with the patient today.  Concerns regarding medicines are outlined above.  Orders Placed This Encounter  Procedures   LONG TERM MONITOR (3-14 DAYS)   EKG 12-Lead   No orders of the defined types were placed in this encounter.   Patient Instructions  Medication Instructions:  Your physician recommends that you continue on your current medications as directed. Please refer to the Current Medication list given to you today.  *If you need a refill on your cardiac medications before your next appointment, please call your pharmacy*  Lab Work: NONE  Testing/Procedures: Your physician has requested you wear a Zio heart monitor for 3 days. This will be mailed to your home with instructions on how to apply the monitor and how to return it when finished. After mailing the heart monitor back, please allow 2 weeks before our office calls you with the results.  Follow-Up: Will be based on results of heart monitor.  Other Instructions ZIO AT Long term monitor-Live Telemetry  Your physician has  requested you wear a ZIO patch monitor for 3 days.  This is a single patch monitor. [employer] supplies one patch monitor per enrollment. Additional  stickers are not available.  Please do not apply patch if you will be having a Nuclear Stress Test, Echocardiogram, Cardiac CT, MRI,  or Chest Xray during the period you would be wearing the monitor. The patch cannot be worn during  these tests. You cannot remove and re-apply the ZIO AT patch monitor.  Your ZIO patch monitor will be mailed 3 day USPS to your address on file. It may take 3-5 days to  receive your monitor after you have been enrolled.  Once you have received your monitor, please review the enclosed instructions. Your monitor has  already been registered assigning a specific monitor serial # to you.   Billing and Patient Assistance Program information  Theodore Demark has been supplied with any insurance information on record for billing. [employer] offers a sliding scale Patient Assistance Program for patients without insurance, or whose  insurance does not completely cover the cost of the ZIO patch monitor. You must apply for the  Patient Assistance Program to qualify for the discounted rate. To apply, call [employer] at [phone],  select option 4, select option 2 , ask to apply for the Patient Assistance Program, (you can request an  interpreter if needed). [employer] will ask your household income and how many people are in your  household. [employer] will quote your out-of-pocket cost based on this information. They will also be able  to set up a 12 month interest free payment plan if needed.  Applying the monitor   Shave hair from upper left chest.  Hold the abrader disc by orange tab. Rub the abrader in 40 strokes over left upper chest as indicated in  your monitor instructions.  Clean area with 4 enclosed alcohol pads. Use all pads to ensure the area is cleaned thoroughly. Let  dry.  Apply patch as indicated in monitor instructions.  Patch will be placed under collarbone on left side of  chest with arrow pointing upward.  Rub patch adhesive wings for 2 minutes. Remove the white label marked "1". Remove the white label  marked "2". Rub patch adhesive wings for 2 additional minutes.  While looking in a mirror, press and release button in center of patch. A small green light will flash 3-4  times. This will be your only indicator that the monitor has been turned on.  Do not shower for the first 24 hours. You may shower after the first 24 hours.  Press the button if you feel a symptom. You will hear a small click. Record Date, Time and Symptom in  the Patient Log.   Starting the Gateway  In your kit there is a Hydrographic surveyor box the size of a cellphone. This is Airline pilot. It transmits all your  recorded data to [facility]. This box must always stay within 10 feet of you. Open the box and push the *  button. There will be a light that blinks orange and then green a few times. When the light stops  blinking, the Gateway is connected to the ZIO patch. Call [employer] at [phone] to confirm your monitor is transmitting.  Returning your monitor  Remove your patch and place it inside the [place]. In the lower half of the Gateway there is a white  bag with prepaid postage on it. Place Gateway in bag and seal. Mail package back to [facility] as soon as  possible. Your physician should have your final report approximately 7 days after you have mailed back  your monitor. Call [place] at [phone] if you have questions regarding your ZIO AT  patch monitor. Call them immediately if you see an orange light blinking on your monitor.  If your monitor falls off in less than 4 days, contact our Monitor department at [phone]. If your  monitor becomes loose or falls off after 4 days call [employer] at [phone] for suggestions on  securing your monitor   Important Information About Sugar          Signed, Sinclair Grooms, MD  03/15/2022 12:33 PM    [place]

## 2022-03-15 ENCOUNTER — Encounter: Payer: Self-pay | Admitting: Interventional Cardiology

## 2022-03-15 ENCOUNTER — Ambulatory Visit
Payer: No Typology Code available for payment source | Attending: Interventional Cardiology | Admitting: Interventional Cardiology

## 2022-03-15 ENCOUNTER — Ambulatory Visit (INDEPENDENT_AMBULATORY_CARE_PROVIDER_SITE_OTHER): Payer: No Typology Code available for payment source

## 2022-03-15 VITALS — BP 106/74 | HR 93 | Ht 61.0 in | Wt 194.4 lb

## 2022-03-15 DIAGNOSIS — R0789 Other chest pain: Secondary | ICD-10-CM | POA: Diagnosis not present

## 2022-03-15 DIAGNOSIS — R002 Palpitations: Secondary | ICD-10-CM

## 2022-03-15 DIAGNOSIS — E7849 Other hyperlipidemia: Secondary | ICD-10-CM

## 2022-03-15 DIAGNOSIS — R9431 Abnormal electrocardiogram [ECG] [EKG]: Secondary | ICD-10-CM

## 2022-03-15 DIAGNOSIS — R7303 Prediabetes: Secondary | ICD-10-CM | POA: Diagnosis not present

## 2022-03-15 NOTE — Patient Instructions (Signed)
Medication Instructions:  Your physician recommends that you continue on your current medications as directed. Please refer to the Current Medication list given to you today.  *If you need a refill on your cardiac medications before your next appointment, please call your pharmacy*  Lab Work: NONE  Testing/Procedures: Your physician has requested you wear a Zio heart monitor for 3 days. This will be mailed to your home with instructions on how to apply the monitor and how to return it when finished. After mailing the heart monitor back, please allow 2 weeks before our office calls you with the results.  Follow-Up: Will be based on results of heart monitor.  Other Instructions ZIO AT Long term monitor-Live Telemetry  Your physician has requested you wear a ZIO patch monitor for 3 days.  This is a single patch monitor. [employer] supplies one patch monitor per enrollment. Additional  stickers are not available.  Please do not apply patch if you will be having a Nuclear Stress Test, Echocardiogram, Cardiac CT, MRI,  or Chest Xray during the period you would be wearing the monitor. The patch cannot be worn during  these tests. You cannot remove and re-apply the ZIO AT patch monitor.  Your ZIO patch monitor will be mailed 3 day USPS to your address on file. It may take 3-5 days to  receive your monitor after you have been enrolled.  Once you have received your monitor, please review the enclosed instructions. Your monitor has  already been registered assigning a specific monitor serial # to you.   Billing and Patient Assistance Program information  Theodore Demark has been supplied with any insurance information on record for billing. [employer] offers a sliding scale Patient Assistance Program for patients without insurance, or whose  insurance does not completely cover the cost of the ZIO patch monitor. You must apply for the  Patient Assistance Program to qualify for the discounted rate. To apply,  call [employer] at [phone],  select option 4, select option 2 , ask to apply for the Patient Assistance Program, (you can request an  interpreter if needed). [employer] will ask your household income and how many people are in your  household. [employer] will quote your out-of-pocket cost based on this information. They will also be able  to set up a 12 month interest free payment plan if needed.  Applying the monitor   Shave hair from upper left chest.  Hold the abrader disc by orange tab. Rub the abrader in 40 strokes over left upper chest as indicated in  your monitor instructions.  Clean area with 4 enclosed alcohol pads. Use all pads to ensure the area is cleaned thoroughly. Let  dry.  Apply patch as indicated in monitor instructions. Patch will be placed under collarbone on left side of  chest with arrow pointing upward.  Rub patch adhesive wings for 2 minutes. Remove the white label marked "1". Remove the white label  marked "2". Rub patch adhesive wings for 2 additional minutes.  While looking in a mirror, press and release button in center of patch. A small green light will flash 3-4  times. This will be your only indicator that the monitor has been turned on.  Do not shower for the first 24 hours. You may shower after the first 24 hours.  Press the button if you feel a symptom. You will hear a small click. Record Date, Time and Symptom in  the Patient Log.   Starting the [employer]  In your kit  there is a small plastic box the size of a cellphone. This is Airline pilot. It transmits all your  recorded data to [facility]. This box must always stay within 10 feet of you. Open the box and push the *  button. There will be a light that blinks orange and then green a few times. When the light stops  blinking, the Gateway is connected to the ZIO patch. Call [employer] at [phone] to confirm your monitor is transmitting.  Returning your monitor  Remove your patch and place it inside the  [place]. In the lower half of the Gateway there is a white  bag with prepaid postage on it. Place Gateway in bag and seal. Mail package back to [place] as soon as  possible. Your physician should have your final report approximately 7 days after you have mailed back  your monitor. Call [place] at [phone] if you have questions regarding your ZIO AT  patch monitor. Call them immediately if you see an orange light blinking on your monitor.  If your monitor falls off in less than 4 days, contact our Monitor department at [phone]. If your  monitor becomes loose or falls off after 4 days call [employer] at [phone] for suggestions on  securing your monitor   Important Information About Sugar

## 2022-03-15 NOTE — Progress Notes (Unsigned)
Enrolled for [employer] to mail a ZIO XT long term holter monitor to the patients address on file.  

## 2022-03-20 DIAGNOSIS — R002 Palpitations: Secondary | ICD-10-CM | POA: Diagnosis not present

## 2022-03-20 DIAGNOSIS — R9431 Abnormal electrocardiogram [ECG] [EKG]: Secondary | ICD-10-CM

## 2022-05-07 ENCOUNTER — Telehealth: Payer: Self-pay | Admitting: *Deleted

## 2022-05-07 NOTE — Telephone Encounter (Signed)
   Pre-operative Risk Assessment    Patient Name: Tanya Weber  DOB: 1955-07-30 MRN: [id number]      Request for Surgical Clearance    Procedure:   CATARACT EXTRACTION BY PE-IOL-LEFT THEN RIGHT EYE  Date of Surgery:  Clearance 06/01/22                                 Surgeon:  DR. Tama High Surgeon's Group or Practice Name:  [facility]  Phone number:  [phone]  Fax number:  [phone]   Type of Clearance Requested:   - Medical ; NO MEDICATIONS LISTED AS NEEDING TO BE HELD   Type of Anesthesia:   IV SEDATION   Additional requests/questions:    Jiles Prows   05/07/2022, 4:28 PM

## 2022-05-07 NOTE — Telephone Encounter (Signed)
   Patient Name: Tanya Weber  DOB: [birth year] MRN: [id number]  Primary Cardiologist: Dr. Daneen Schick  Chart reviewed as part of pre-operative protocol coverage, including last visit. Cataract extractions are recognized in guidelines as low risk surgeries that do not typically require specific preoperative testing or holding of blood thinner therapy. Therefore, given past medical history and time since last visit, based on ACC/AHA guidelines, Tanya Weber would be at acceptable risk for the planned procedure without further cardiovascular testing.   I will route this recommendation to the requesting party via Epic fax function and remove from pre-op pool.  Please call with questions.  Charlie Pitter, PA-C 05/07/2022, 4:38 PM

## 2022-05-11 HISTORY — PX: CATARACT EXTRACTION: SUR2

## 2022-09-07 ENCOUNTER — Other Ambulatory Visit: Payer: Self-pay | Admitting: Obstetrics and Gynecology

## 2022-09-07 DIAGNOSIS — Z0000 Encounter for general adult medical examination without abnormal findings: Secondary | ICD-10-CM

## 2022-10-11 NOTE — Progress Notes (Signed)
67 y.o. [id number] Married [place] American female here for annual exam.    Power went out in the building, so the patient is rescheduling.   PCP:   Dr. Kateri Plummer.   Patient's last menstrual period was 11/10/2011.           Sexually active: Yes.    The current method of family planning is post menopausal status/vasectomy.    Exercising: Yes.     Walking, stationary bike Smoker:  no  Health Maintenance: Pap:  10/20/21 neg: HR HPV neg, 07/25/18 neg: HR HPV neg History of abnormal Pap:  yes, 06-15-15 LGSIL  MMG: 10/22/22 Breast Density Cat B, BI-RADS CAT 1 neg Colonoscopy:  12/19/06 BMD:   10/26/21  Result  osteopenic TDaP:  07/06/16 Gardasil:   no HIV: 07/06/16 NR Hep C: 07/06/16 neg Screening Labs:  none   reports that she has never smoked. She has never used smokeless tobacco. She reports that she does not drink alcohol and does not use drugs.  Past Medical History:  Diagnosis Date   Anxiety    Depression    Elevated hemoglobin A1c    Elevated testosterone level in female 10/2017   normal free testosterone   Elevated TSH 2021   Normal free T4   Environmental allergies    Fibroids    Gallbladder problem    GERD (gastroesophageal reflux disease)    History of postpartum depression 1987   Hypermagnesemia 2019   IBS (irritable bowel syndrome)    Joint pain    Lactose intolerance    Obesity    Osteopenia 10/2021   spine and hip   Prediabetes    Vitamin D deficiency     Past Surgical History:  Procedure Laterality Date   CATARACT EXTRACTION  05/2022   CESAREAN SECTION     CHOLECYSTECTOMY N/A 03/02/2019   Procedure: LAPAROSCOPIC CHOLECYSTECTOMY;  Surgeon: Abigail Miyamoto, MD;  Location: [facility] OR;  Service: General;  Laterality: N/A;    Current Outpatient Medications  Medication Sig Dispense Refill   Acetaminophen (TYLENOL PO) Take 500 mg by mouth as needed.     Camphor-Eucalyptus-Menthol (VICKS VAPORUB EX) Apply 1 application topically daily as needed (congestion).      Cholecalciferol (VITAMIN D3) 125 MCG (5000 UT) CAPS Take 1 capsule by mouth daily.     diclofenac Sodium (VOLTAREN) 1 % GEL Apply topically 4 (four) times daily.     doxepin (SINEQUAN) 10 MG/ML solution 0.3 mg at bedtime.     Multiple Vitamin (MULTIVITAMIN) capsule Take 1 capsule by mouth daily.     Omega-3 Fatty Acids (FISH OIL) 1000 MG CAPS Take 1 capsule by mouth daily.     predniSONE 5 MG/5ML solution Take by mouth daily with breakfast.     No current facility-administered medications for this visit.    Family History  Problem Relation Age of Onset   Diabetes Mother    Obesity Mother    Heart attack Father 55       deceased   Hypertension Sister    Breast cancer Cousin 28   Diabetes Brother 67       IDDM    Review of Systems  All other systems reviewed and are negative.   Exam:   BP 120/78 (BP Location: Left Arm, Patient Position: Sitting, Cuff Size: Large)   Pulse 94   Ht 5' 1.5" (1.562 m)   Wt 192 lb (87.1 kg)   LMP 11/10/2011   SpO2 99%   BMI 35.69 kg/m  General appearance: alert, cooperative and appears stated age   Assessment:     Erroneous entry.   Plan:  Patient will return to do her annual exam/office visit on another date.

## 2022-10-22 ENCOUNTER — Ambulatory Visit
Admission: RE | Admit: 2022-10-22 | Discharge: 2022-10-22 | Disposition: A | Discharge status: Home / Self Care | Payer: Medicare Other | Source: Ambulatory Visit | Attending: Obstetrics and Gynecology | Admitting: Obstetrics and Gynecology

## 2022-10-22 DIAGNOSIS — Z0000 Encounter for general adult medical examination without abnormal findings: Secondary | ICD-10-CM

## 2022-10-25 ENCOUNTER — Encounter: Payer: Self-pay | Admitting: Obstetrics and Gynecology

## 2022-10-25 ENCOUNTER — Ambulatory Visit: Payer: Medicare Other | Admitting: Obstetrics and Gynecology

## 2022-10-30 ENCOUNTER — Ambulatory Visit (INDEPENDENT_AMBULATORY_CARE_PROVIDER_SITE_OTHER): Payer: Medicare Other | Admitting: Obstetrics and Gynecology

## 2022-10-30 ENCOUNTER — Encounter: Payer: Self-pay | Admitting: Obstetrics and Gynecology

## 2022-10-30 VITALS — BP 128/70 | HR 92 | Ht 61.5 in | Wt 192.0 lb

## 2022-10-30 DIAGNOSIS — M8589 Other specified disorders of bone density and structure, multiple sites: Secondary | ICD-10-CM | POA: Diagnosis not present

## 2022-10-30 DIAGNOSIS — Z01419 Encounter for gynecological examination (general) (routine) without abnormal findings: Secondary | ICD-10-CM | POA: Diagnosis not present

## 2022-10-30 NOTE — Progress Notes (Unsigned)
67 y.o. [id number] Married [place] American female here for breast and pelvic exam.  Patient is followed for osteopenia.    Retired.  Husband is in [place].  He has cognitive issues.   PCP:   Dr. Kateri Plummer.   Patient's last menstrual period was 11/10/2011.           Sexually active: Yes.    The current method of family planning is post menopausal status/vasectomy.    Exercising: Yes.     Walking, stationary bike Smoker:  no  Health Maintenance: Pap:  10/20/21 neg: HR HPV neg, 07/25/18 neg: HR HPV neg  History of abnormal Pap:   yes, 06-15-15 LGSIL, neg HR HPV  MMG:  10/22/22 Breast Density Cat B, BI-RADS CAT 1 neg  Colonoscopy:  2023 - polyp - due in 10 years per patient.  [facility]. BMD:   10/26/21  Result  osteopenic TDaP:  07/06/16 Gardasil:   no HIV: 07/06/16 NR Hep C: 07/06/16 neg Screening Labs: PCP   reports that she has never smoked. She has never used smokeless tobacco. She reports that she does not drink alcohol and does not use drugs.  Past Medical History:  Diagnosis Date   Anxiety    Depression    Elevated hemoglobin A1c    Elevated testosterone level in female 10/2017   normal free testosterone   Elevated TSH 2021   Normal free T4   Environmental allergies    Fibroids    Gallbladder problem    GERD (gastroesophageal reflux disease)    History of postpartum depression 1987   Hypermagnesemia 2019   IBS (irritable bowel syndrome)    Joint pain    Lactose intolerance    Obesity    Osteopenia 10/2021   spine and hip   Prediabetes    Vitamin D deficiency     Past Surgical History:  Procedure Laterality Date   CATARACT EXTRACTION  05/2022   CESAREAN SECTION     CHOLECYSTECTOMY N/A 03/02/2019   Procedure: LAPAROSCOPIC CHOLECYSTECTOMY;  Surgeon: Abigail Miyamoto, MD;  Location: [facility] OR;  Service: General;  Laterality: N/A;    Current Outpatient Medications  Medication Sig Dispense Refill   Acetaminophen (TYLENOL PO) Take 500 mg by mouth as needed.      Camphor-Eucalyptus-Menthol (VICKS VAPORUB EX) Apply 1 application topically daily as needed (congestion).     Cholecalciferol (VITAMIN D3) 125 MCG (5000 UT) CAPS Take 1 capsule by mouth daily.     diclofenac Sodium (VOLTAREN) 1 % GEL Apply topically 4 (four) times daily.     doxepin (SINEQUAN) 10 MG/ML solution 0.3 mg at bedtime.     Multiple Vitamin (MULTIVITAMIN) capsule Take 1 capsule by mouth daily.     Omega-3 Fatty Acids (FISH OIL) 1000 MG CAPS Take 1 capsule by mouth daily.     predniSONE 5 MG/5ML solution Take by mouth daily with breakfast.     No current facility-administered medications for this visit.    Family History  Problem Relation Age of Onset   Diabetes Mother    Obesity Mother    Heart attack Father 78       deceased   Hypertension Sister    Breast cancer Cousin 96   Diabetes Brother 26       IDDM    Review of Systems  All other systems reviewed and are negative.   Exam:   BP 128/70 (BP Location: Left Arm, Patient Position: Sitting, Cuff Size: Large)   Pulse 92   Ht 5'  1.5" (1.562 m)   Wt 192 lb (87.1 kg)   LMP 11/10/2011   SpO2 98%   BMI 35.69 kg/m     General appearance: alert, cooperative and appears stated age Head: normocephalic, without obvious abnormality, atraumatic Neck: no adenopathy, supple, symmetrical, trachea midline and thyroid normal to inspection and palpation Lungs: clear to auscultation bilaterally Breasts: normal appearance, no masses or tenderness, No nipple retraction or dimpling, No nipple discharge or bleeding, No axillary adenopathy Heart: regular rate and rhythm Abdomen: soft, non-tender; no masses, no organomegaly Extremities: extremities normal, atraumatic, no cyanosis or edema Skin: skin color, texture, turgor normal. No rashes or lesions Lymph nodes: cervical, supraclavicular, and axillary nodes normal. Neurologic: grossly normal  Pelvic: External genitalia:  no lesions              No abnormal inguinal nodes  palpated.              Urethra:  normal appearing urethra with no masses, tenderness or lesions              Bartholins and Skenes: normal                 Vagina: normal appearing vagina with normal color and discharge, no lesions              Cervix: no lesions              Pap taken: no Bimanual Exam:  Uterus:  normal size, contour, position, consistency, mobility, non-tender              Adnexa: no mass, fullness, tenderness              Rectal exam: yes.  Confirms.              Anus:  normal sphincter tone, no lesions  Chaperone was present for exam:  Warren Lacy, CMA  Assessment:   Well woman visit with gynecologic exam. Hx LGSIL.  Hx fibroids.  Osteopenia.  Low risk for fracture by FRAX.  Plan: Mammogram screening discussed. Self breast awareness reviewed. Pap and HR HPV 2028. Guidelines for Calcium, Vitamin D, regular exercise program including cardiovascular and weight bearing exercise. We reviewed her prior bone density and FRAX risk. BMD due in 1 year.  Follow up annually and prn.   10  total time was spent for this patient encounter, including preparation, face-to-face counseling with the patient, coordination of care, and documentation of the encounter regarding osteopenia in addition to performing her breast and pelvic exam.

## 2022-10-30 NOTE — Patient Instructions (Signed)

## 2023-02-12 IMAGING — MG MM DIGITAL SCREENING BILAT W/ TOMO AND CAD
8 series · 8 of 24 positions shown · non-contrast
Comparison: Previous exam(s).

ACR Breast Density Category a: The breast tissue is almost entirely
fatty.

CLINICAL DATA: Screening.

EXAM:
DIGITAL SCREENING BILATERAL MAMMOGRAM WITH TOMOSYNTHESIS AND CAD
TECHNIQUE: Bilateral screening digital craniocaudal and mediolateral oblique
mammograms were obtained. Bilateral screening digital breast
tomosynthesis was performed. The images were evaluated with
computer-aided detection.

[L MLO synth-2D]
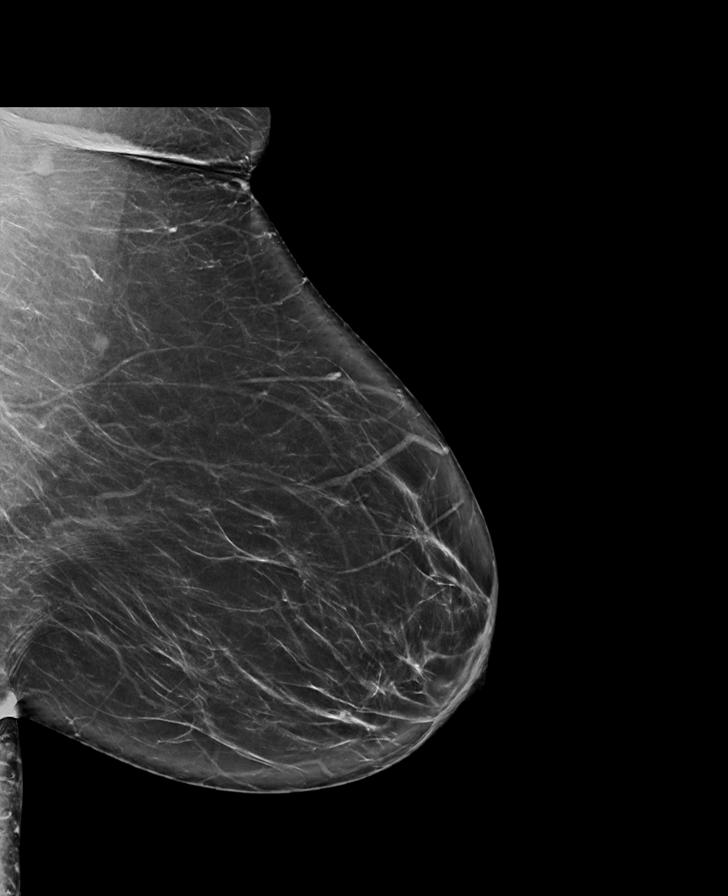

[L CC synth-2D]
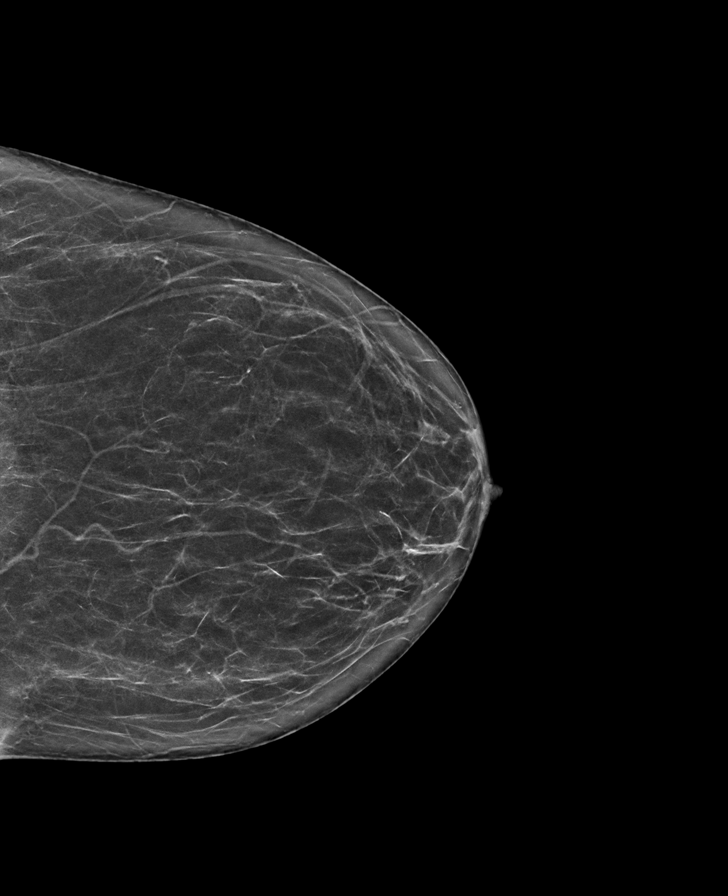

[R MLO synth-2D]
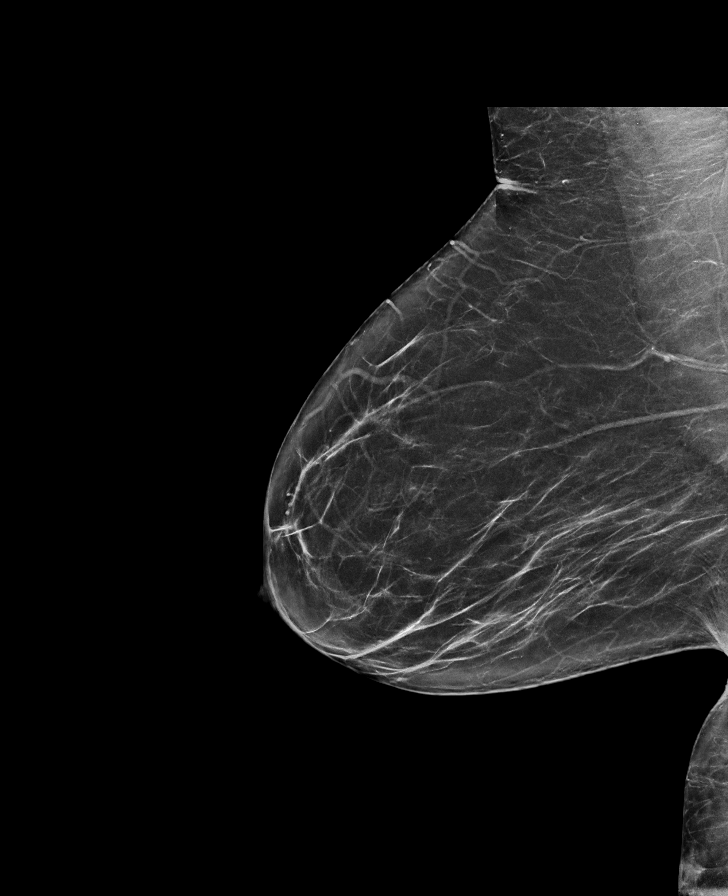

[R CC synth-2D]
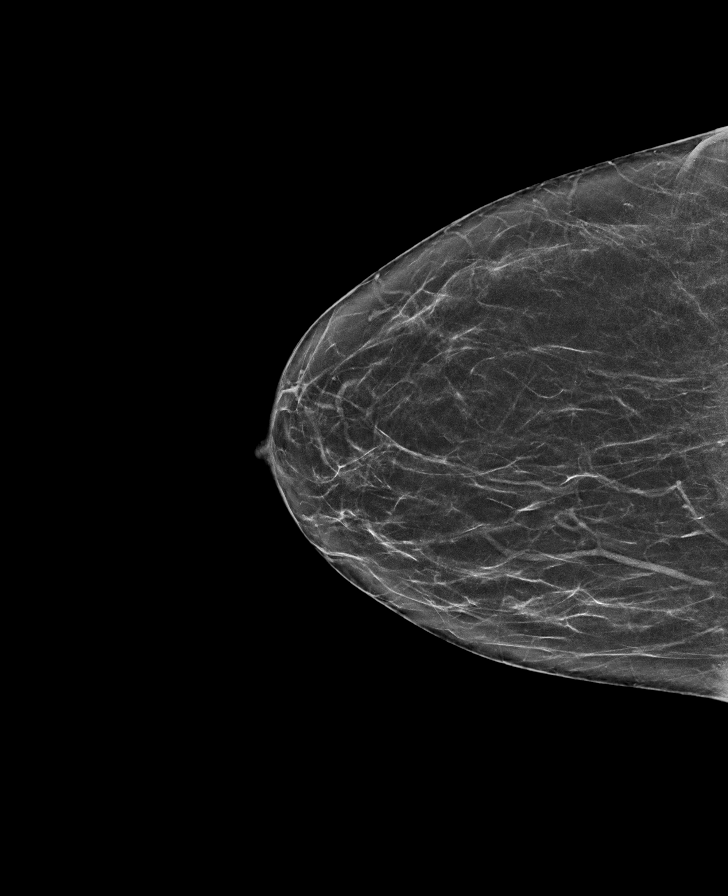

[R CC tomo · tomo slice 33/65.0]
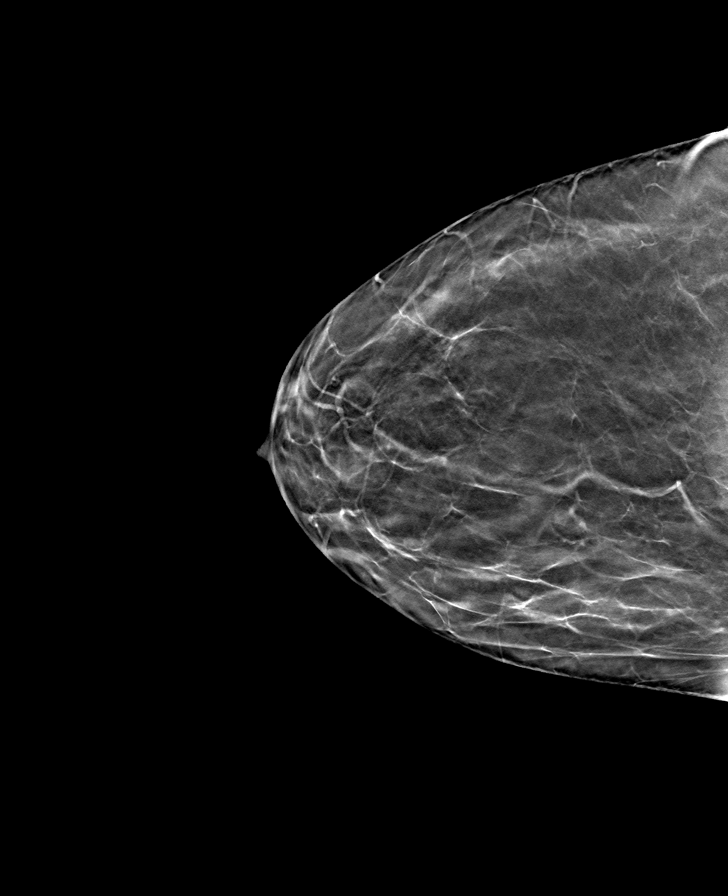

[R MLO tomo · tomo slice 35/70.0]
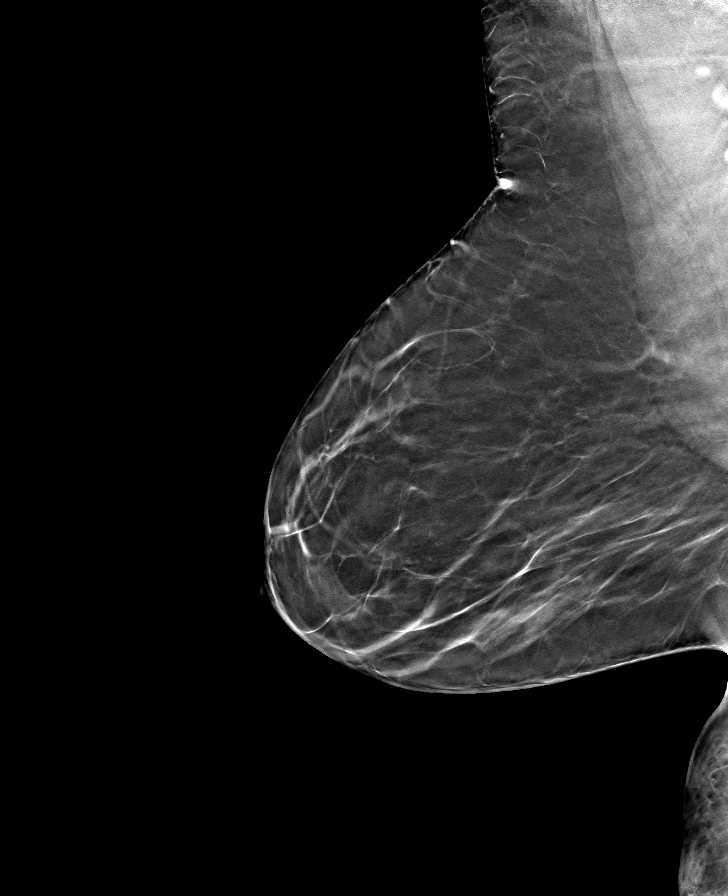

[L MLO tomo · tomo slice 40/79.0]
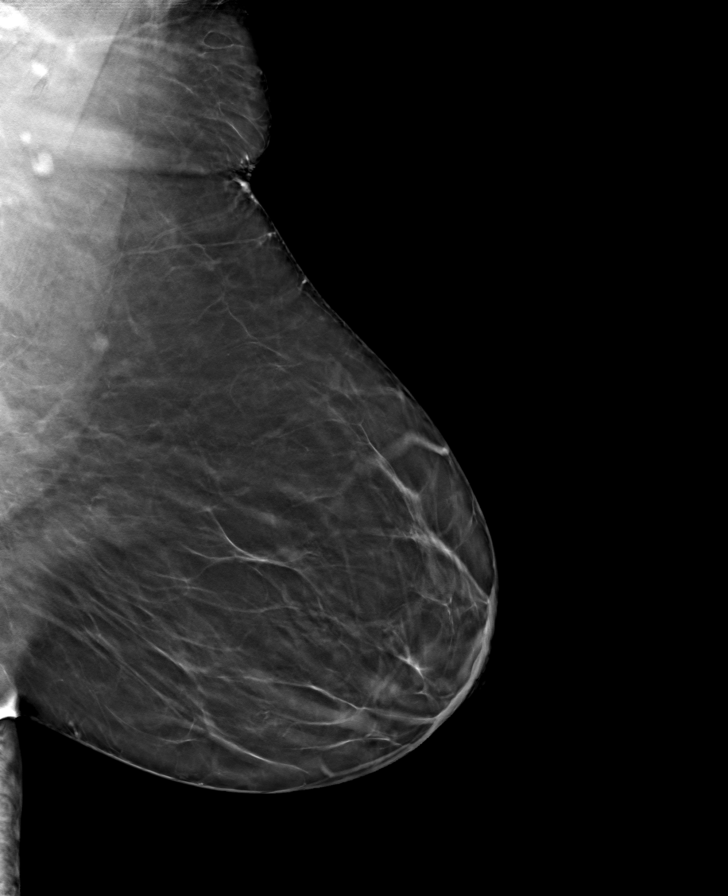

[L CC tomo · tomo slice 33/66.0]
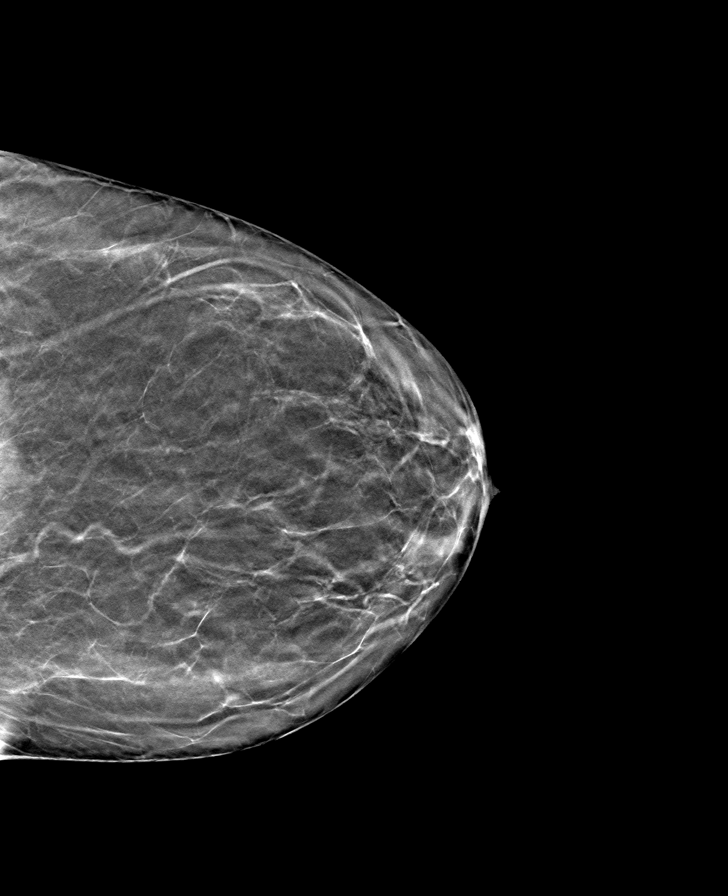

[8 of 24 positions shown; findings below may reference images not displayed]

FINDINGS: There are no findings suspicious for malignancy.
IMPRESSION: No mammographic evidence of malignancy. A result letter of this
screening mammogram will be mailed directly to the patient.

RECOMMENDATION:
Screening mammogram in one year. (Code:0E-3-N98)

BI-RADS CATEGORY  1: Negative.

## 2023-06-19 ENCOUNTER — Other Ambulatory Visit (HOSPITAL_COMMUNITY)
Admission: RE | Admit: 2023-06-19 | Discharge: 2023-06-19 | Disposition: A | Discharge status: Home / Self Care | Payer: Medicare HMO | Source: Ambulatory Visit | Attending: Obstetrics and Gynecology | Admitting: Obstetrics and Gynecology

## 2023-06-19 ENCOUNTER — Encounter: Payer: Self-pay | Admitting: Obstetrics and Gynecology

## 2023-06-19 ENCOUNTER — Ambulatory Visit: Payer: Medicare Other | Admitting: Obstetrics and Gynecology

## 2023-06-19 VITALS — BP 126/84 | HR 99 | Wt 187.0 lb

## 2023-06-19 DIAGNOSIS — N9489 Other specified conditions associated with female genital organs and menstrual cycle: Secondary | ICD-10-CM

## 2023-06-19 DIAGNOSIS — R457 State of emotional shock and stress, unspecified: Secondary | ICD-10-CM | POA: Diagnosis not present

## 2023-06-19 DIAGNOSIS — N952 Postmenopausal atrophic vaginitis: Secondary | ICD-10-CM

## 2023-06-19 LAB — URINALYSIS, COMPLETE W/RFL CULTURE
Bacteria, UA: NONE SEEN /[HPF]
Bilirubin Urine: NEGATIVE
Glucose, UA: NEGATIVE
Hgb urine dipstick: NEGATIVE
Hyaline Cast: NONE SEEN /[LPF]
Ketones, ur: NEGATIVE
Leukocyte Esterase: NEGATIVE
Nitrites, Initial: NEGATIVE
Protein, ur: NEGATIVE
RBC / HPF: NONE SEEN /[HPF] (ref 0–2)
Specific Gravity, Urine: 1.01 (ref 1.001–1.035)
WBC, UA: NONE SEEN /[HPF] (ref 0–5)
pH: 6 (ref 5.0–8.0)

## 2023-06-19 LAB — NO CULTURE INDICATED

## 2023-06-19 MED ORDER — ESTRADIOL 0.1 MG/GM VA CREA: TOPICAL_CREAM | VAGINAL | 1 refills | Status: DC

## 2023-06-19 NOTE — Progress Notes (Signed)
 GYNECOLOGY  VISIT   HPI: 68 y.o.   Married  African American female   [phone] with Patient's last menstrual period was 11/10/2011.   here for: burning-- started using Replens Vaginal moisturizer and noticed burning for a few days.  Replens is causing burning.    No vaginal discharge or odor.   Used vaginal estrogen in the past.   Patient has a new friend and may become sexually active after a long period of abstinence.  He lives in [place].  She is going to visit and will return here in May.  He gives her a reason to smile.   Patient and her husband are separated and are divorcing.  There has been significant strife in the extended family, and her husband has a diagnosis of frontal temporal dementia. Patient has experienced a lot of emotional pain and suffering and has had counseling.  She takes Doxepin for sleep.   GYNECOLOGIC HISTORY: Patient's last menstrual period was 11/10/2011. Contraception: PMP/vasectomy Menopausal hormone therapy:  n/a Last 2 paps:  10/20/21 neg: HR HPV neg, 07/25/18 neg: HR HPV neg History of abnormal Pap or positive HPV:  yes, 06-15-15 LGSIL, neg HR HPV  Mammogram:  10/12/22 Breast Density Cat B, BI-RADS CAT 1 neg        OB History     Gravida  4   Para  3   Term  3   Preterm      AB  1   Living  3      SAB  1   IAB      Ectopic      Multiple      Live Births                 Patient Active Problem List   Diagnosis Date Noted   Subclinical hypothyroidism 04/12/2020   Other irritable bowel syndrome 04/12/2020   Other hyperlipidemia 04/12/2020   At risk for heart disease 04/12/2020   Other fatigue 03/29/2020   SOB (shortness of breath) on exertion 03/29/2020   Prediabetes 03/29/2020   Vitamin D  deficiency 03/29/2020   Gastroesophageal reflux disease with esophagitis 03/29/2020   Mood disorder with emotional eating 03/29/2020   Lactose intolerance 03/29/2020   Class 1 obesity with serious comorbidity and body mass index  (BMI) of 34.0 to 34.9 in adult 03/29/2020   Fibroids 01/27/2019   Calcification of ovary 01/27/2019   RLQ abdominal pain 01/27/2019    Past Medical History:  Diagnosis Date   Anxiety    Depression    Elevated hemoglobin A1c    Elevated testosterone  level in female 10/2017   normal free testosterone    Elevated TSH 2021   Normal free T4   Environmental allergies    Fibroids    Gallbladder problem    GERD (gastroesophageal reflux disease)    History of postpartum depression 1987   Hypermagnesemia 2019   IBS (irritable bowel syndrome)    Joint pain    Lactose intolerance    Obesity    Osteopenia 10/2021   spine and hip   Prediabetes    Vitamin D  deficiency     Past Surgical History:  Procedure Laterality Date   CATARACT EXTRACTION  05/2022   CESAREAN SECTION     CHOLECYSTECTOMY N/A 03/02/2019   Procedure: LAPAROSCOPIC CHOLECYSTECTOMY;  Surgeon: Vernetta Berg, MD;  Location: [facility] OR;  Service: General;  Laterality: N/A;    Current Outpatient Medications  Medication Sig Dispense Refill   Acetaminophen  (TYLENOL  PO)  Take 500 mg by mouth as needed.     Camphor-Eucalyptus-Menthol (VICKS VAPORUB EX) Apply 1 application topically daily as needed (congestion).     Cholecalciferol (VITAMIN D3) 125 MCG (5000 UT) CAPS Take 1 capsule by mouth daily.     diclofenac Sodium (VOLTAREN) 1 % GEL Apply topically 4 (four) times daily.     doxepin (SINEQUAN) 10 MG/ML solution 0.3 mg at bedtime.     Multiple Vitamin (MULTIVITAMIN) capsule Take 1 capsule by mouth daily.     Omega-3 Fatty Acids (FISH OIL ) 1000 MG CAPS Take 1 capsule by mouth daily.     No current facility-administered medications for this visit.     ALLERGIES: Patient has no known allergies.  Family History  Problem Relation Age of Onset   Diabetes Mother    Obesity Mother    Heart attack Father 81       deceased   Hypertension Sister    Breast cancer Cousin 1   Diabetes Brother 48       IDDM    Social History    Socioeconomic History   Marital status: Married    Spouse name: Web Designer   Number of children: Not on file   Years of education: Not on file   Highest education level: Not on file  Occupational History   Occupation: Publishing rights manager  Tobacco Use   Smoking status: Never   Smokeless tobacco: Never  Vaping Use   Vaping status: Never Used  Substance and Sexual Activity   Alcohol use: No    Alcohol/week: 0.0 standard drinks of alcohol   Drug use: No   Sexual activity: Yes    Partners: Male    Birth control/protection: Other-see comments, Post-menopausal    Comment: vasectomy  Other Topics Concern   Not on file  Social History Narrative   Not on file   Social Drivers of Health   Financial Resource Strain: Not on file  Food Insecurity: Not on file  Transportation Needs: Not on file  Physical Activity: Not on file  Stress: Not on file  Social Connections: Unknown (10/24/2021)   [facility], [facility]   Social Network    Social Network: Not on file  Intimate Partner Violence: Unknown (09/15/2021)   Received from [facility], [facility]   HITS    Physically Hurt: Not on file    Insult or Talk Down To: Not on file    Threaten Physical Harm: Not on file    Scream or Curse: Not on file    Review of Systems  Genitourinary:  Positive for vaginal pain.  All other systems reviewed and are negative.   PHYSICAL EXAMINATION:   BP 126/84 (BP Location: Left Arm, Patient Position: Sitting, Cuff Size: Small)   Pulse 99   Wt 187 lb (84.8 kg)   LMP 11/10/2011   SpO2 99%   BMI 34.76 kg/m     General appearance: alert, cooperative and appears stated age   Pelvic: External genitalia:  no lesions              Urethra:  normal appearing urethra with no masses, tenderness or lesions              Bartholins and Skenes: normal                 Vagina: normal appearing vagina with normal color and discharge, no lesions              Cervix: no  lesions                 Bimanual Exam:  Uterus:  normal size, contour, position, consistency, mobility, non-tender              Adnexa: no mass, fullness, tenderness                Chaperone was present for exam:  Damien FALCON, CMA  ASSESSMENT:  Vaginal atrophy.  Vaginal burning.  Emotional stress.   PLAN:  Vaginitis testing with [facility].  Urinalysis:  sg 1.010, ph 6.0, negative microscopic exam.  No UC needed.  Rx for vaginal estradiol  cream.  Instructed in use.  I discussed potential effect on breast cancer.  Condoms with spermicide recommended.  Return for follow up visit in 5 months.  Would do STD screening then if sexual activity has resumed.  Emotional support given to the patient.   30 min  total time was spent for this patient encounter, including preparation, face-to-face counseling with the patient, coordination of care, and documentation of the encounter.

## 2023-06-20 LAB — CERVICOVAGINAL ANCILLARY ONLY
Bacterial Vaginitis (gardnerella): NEGATIVE
Candida Glabrata: NEGATIVE
Candida Vaginitis: NEGATIVE
Comment: NEGATIVE
Comment: NEGATIVE
Comment: NEGATIVE
Comment: NEGATIVE
Trichomonas: NEGATIVE

## 2023-09-30 ENCOUNTER — Other Ambulatory Visit: Payer: Self-pay | Admitting: Obstetrics and Gynecology

## 2023-09-30 DIAGNOSIS — Z1231 Encounter for screening mammogram for malignant neoplasm of breast: Secondary | ICD-10-CM

## 2023-10-29 ENCOUNTER — Ambulatory Visit
Admission: RE | Admit: 2023-10-29 | Discharge: 2023-10-29 | Disposition: A | Discharge status: Home / Self Care | Source: Ambulatory Visit | Attending: Obstetrics and Gynecology | Admitting: Obstetrics and Gynecology

## 2023-10-29 DIAGNOSIS — Z1231 Encounter for screening mammogram for malignant neoplasm of breast: Secondary | ICD-10-CM

## 2023-11-01 DIAGNOSIS — M79671 Pain in right foot: Secondary | ICD-10-CM | POA: Diagnosis not present

## 2023-11-01 DIAGNOSIS — Z0000 Encounter for general adult medical examination without abnormal findings: Secondary | ICD-10-CM | POA: Diagnosis not present

## 2023-11-01 DIAGNOSIS — R7303 Prediabetes: Secondary | ICD-10-CM | POA: Diagnosis not present

## 2023-11-01 DIAGNOSIS — R252 Cramp and spasm: Secondary | ICD-10-CM | POA: Diagnosis not present

## 2023-11-01 DIAGNOSIS — E785 Hyperlipidemia, unspecified: Secondary | ICD-10-CM | POA: Diagnosis not present

## 2023-11-01 DIAGNOSIS — J309 Allergic rhinitis, unspecified: Secondary | ICD-10-CM | POA: Diagnosis not present

## 2023-11-06 ENCOUNTER — Ambulatory Visit: Payer: Self-pay | Admitting: Obstetrics and Gynecology

## 2023-11-14 NOTE — Progress Notes (Signed)
 GYNECOLOGY  VISIT   HPI: 68 y.o.   Married  [place] female   [phone] with Patient's last menstrual period was 11/10/2011.   here for: 5 month follow up - Estradiol  cream. Patient states she has stopped using it and only using as she feels like she needs it.      Back from [place].   Had an intimate partner while she was there.  Used a condom.  He had neg HIV screening.  She is retired.  Daughter is [place].   GYNECOLOGIC HISTORY: Patient's last menstrual period was 11/10/2011. Contraception:  Vasectomy Menopausal hormone therapy:  Estrace  Last 2 paps:  10/20/21 neg, HR HPV, 07/25/18 neg HR HPV neg History of abnormal Pap or positive HPV:  no Mammogram:  10/29/23 Breast Density Cat A, BIRADS Cat 1 neg   Colonoscopy 2023 - polyp - due in 2033.  BMD 10/26/21 - osteopenia. Exercise - 3 - 4 times per week.  Running, bicycle, weights.         OB History     Gravida  4   Para  3   Term  3   Preterm      AB  1   Living  3      SAB  1   IAB      Ectopic      Multiple      Live Births                 Patient Active Problem List   Diagnosis Date Noted   Subclinical hypothyroidism 04/12/2020   Other irritable bowel syndrome 04/12/2020   Other hyperlipidemia 04/12/2020   At risk for heart disease 04/12/2020   Other fatigue 03/29/2020   SOB (shortness of breath) on exertion 03/29/2020   Prediabetes 03/29/2020   Vitamin D  deficiency 03/29/2020   Gastroesophageal reflux disease with esophagitis 03/29/2020   Mood disorder with emotional eating 03/29/2020   Lactose intolerance 03/29/2020   Class 1 obesity with serious comorbidity and body mass index (BMI) of 34.0 to 34.9 in adult 03/29/2020   Fibroids 01/27/2019   Calcification of ovary 01/27/2019   RLQ abdominal pain 01/27/2019    Past Medical History:  Diagnosis Date   Anxiety    Depression    Elevated hemoglobin A1c    Elevated testosterone  level in female 10/2017   normal free  testosterone    Elevated TSH 2021   Normal free T4   Environmental allergies    Fibroids    Gallbladder problem    GERD (gastroesophageal reflux disease)    History of postpartum depression 1987   Hypermagnesemia 2019   IBS (irritable bowel syndrome)    Joint pain    Lactose intolerance    Obesity    Osteopenia 10/2021   spine and hip   Prediabetes    Vitamin D  deficiency     Past Surgical History:  Procedure Laterality Date   CATARACT EXTRACTION  05/2022   CESAREAN SECTION     CHOLECYSTECTOMY N/A 03/02/2019   Procedure: LAPAROSCOPIC CHOLECYSTECTOMY;  Surgeon: Oza Blumenthal, MD;  Location: [facility] OR;  Service: General;  Laterality: N/A;    Current Outpatient Medications  Medication Sig Dispense Refill   Acetaminophen  (TYLENOL  PO) Take 500 mg by mouth as needed.     Camphor-Eucalyptus-Menthol (VICKS VAPORUB EX) Apply 1 application topically daily as needed (congestion).     Cholecalciferol (VITAMIN D3) 125 MCG (5000 UT) CAPS Take 1 capsule by mouth daily.  diclofenac Sodium (VOLTAREN) 1 % GEL Apply topically 4 (four) times daily.     doxepin (SINEQUAN) 10 MG/ML solution 0.3 mg at bedtime.     estradiol  (ESTRACE ) 0.1 MG/GM vaginal cream Use 1/2 g vaginally every night for the first 2 weeks, then use 1/2 g vaginally two or three times per week as needed to maintain symptom relief. 42.5 g 1   Multiple Vitamin (MULTIVITAMIN) capsule Take 1 capsule by mouth daily.     Omega-3 Fatty Acids (FISH OIL ) 1000 MG CAPS Take 1 capsule by mouth daily.     No current facility-administered medications for this visit.     ALLERGIES: Milk-related compounds and Psyllium  Family History  Problem Relation Age of Onset   Diabetes Mother    Obesity Mother    Heart attack Father 15       deceased   Hypertension Sister    Breast cancer Cousin 55   Diabetes Brother 27       IDDM    Social History   Socioeconomic History   Marital status: Married    Spouse name: Web designer   Number of  children: Not on file   Years of education: Not on file   Highest education level: Not on file  Occupational History   Occupation: Publishing rights manager  Tobacco Use   Smoking status: Never   Smokeless tobacco: Never  Vaping Use   Vaping status: Never Used  Substance and Sexual Activity   Alcohol use: No    Alcohol/week: 0.0 standard drinks of alcohol   Drug use: No   Sexual activity: Yes    Partners: Male    Birth control/protection: Other-see comments, Post-menopausal    Comment: vasectomy  Other Topics Concern   Not on file  Social History Narrative   Not on file   Social Drivers of Health   Financial Resource Strain: Not on file  Food Insecurity: Not on file  Transportation Needs: Not on file  Physical Activity: Not on file  Stress: Not on file  Social Connections: Unknown (10/24/2021)   [facility]   Social Network    Social Network: Not on file  Intimate Partner Violence: Unknown (09/15/2021)   Received from [facility], [facility]   HITS    Physically Hurt: Not on file    Insult or Talk Down To: Not on file    Threaten Physical Harm: Not on file    Scream or Curse: Not on file    Review of Systems  All other systems reviewed and are negative.   PHYSICAL EXAMINATION:   BP 124/82 (BP Location: Left Arm, Patient Position: Sitting)   Pulse 88   Ht 5' 2.25" (1.581 m)   Wt 179 lb (81.2 kg)   LMP 11/10/2011   SpO2 98%   BMI 32.48 kg/m     General appearance: alert, cooperative and appears stated age Head: Normocephalic, without obvious abnormality, atraumatic Neck: no adenopathy, supple, symmetrical, trachea midline and thyroid  normal to inspection and palpation Lungs: clear to auscultation bilaterally Breasts: normal appearance, no masses or tenderness, No nipple retraction or dimpling, No nipple discharge or bleeding, No axillary or supraclavicular adenopathy Heart: regular rate and rhythm Abdomen: soft, non-tender, no  masses,  no organomegaly Extremities: extremities normal, atraumatic, no cyanosis or edema Skin: Skin color, texture, turgor normal. No rashes or lesions Lymph nodes: Cervical, supraclavicular, and axillary nodes normal. No abnormal inguinal nodes palpated Neurologic: Grossly normal  Pelvic: External genitalia:  no  lesions              Urethra:  normal appearing urethra with no masses, tenderness or lesions              Bartholins and Skenes: normal                 Vagina: normal appearing vagina with normal color and discharge, no lesions              Cervix: no lesions.  Pap not collected.                 Bimanual Exam:  Uterus:  normal size, contour, position, consistency, mobility, non-tender              Adnexa: no mass, fullness, tenderness              Rectal exam: yes.  Confirms.              Anus:  normal sphincter tone, no lesions  Chaperone was present for exam:  Cottie Diss, CMA  ASSESSMENT:  Encounter for breast and pelvic exam.  Vaginal atrophy.  Encounter for medication monitoring.  STD screening.  Menopausal female.  Osteopenia.   PLAN:  Pap and HR HPV in 2028,  Self breast exam reviewed.  Mammogram yearly.  STD screening.  Refill of vaginal estradiol  cream.  I discussed potential effect on breast cancer.  [facility]  Follow up yearly and prn.   20 min  total time was spent for this patient encounter, including preparation, face-to-face counseling with the patient, coordination of care, and documentation of the encounter in addition to doing the breast and pelvic exam.

## 2023-11-15 DIAGNOSIS — H33101 Unspecified retinoschisis, right eye: Secondary | ICD-10-CM | POA: Diagnosis not present

## 2023-11-18 ENCOUNTER — Encounter: Payer: Self-pay | Admitting: Obstetrics and Gynecology

## 2023-11-18 ENCOUNTER — Other Ambulatory Visit (HOSPITAL_COMMUNITY)
Admission: RE | Admit: 2023-11-18 | Discharge: 2023-11-18 | Disposition: A | Discharge status: Home / Self Care | Source: Ambulatory Visit | Attending: Obstetrics and Gynecology | Admitting: Obstetrics and Gynecology

## 2023-11-18 ENCOUNTER — Ambulatory Visit (INDEPENDENT_AMBULATORY_CARE_PROVIDER_SITE_OTHER): Payer: Medicare HMO | Admitting: Obstetrics and Gynecology

## 2023-11-18 VITALS — BP 124/82 | HR 88 | Ht 62.25 in | Wt 179.0 lb

## 2023-11-18 DIAGNOSIS — Z7251 High risk heterosexual behavior: Secondary | ICD-10-CM

## 2023-11-18 DIAGNOSIS — N952 Postmenopausal atrophic vaginitis: Secondary | ICD-10-CM | POA: Diagnosis not present

## 2023-11-18 DIAGNOSIS — Z01419 Encounter for gynecological examination (general) (routine) without abnormal findings: Secondary | ICD-10-CM

## 2023-11-18 DIAGNOSIS — Z1159 Encounter for screening for other viral diseases: Secondary | ICD-10-CM

## 2023-11-18 DIAGNOSIS — Z780 Asymptomatic menopausal state: Secondary | ICD-10-CM

## 2023-11-18 DIAGNOSIS — Z5181 Encounter for therapeutic drug level monitoring: Secondary | ICD-10-CM | POA: Diagnosis not present

## 2023-11-18 DIAGNOSIS — Z9189 Other specified personal risk factors, not elsewhere classified: Secondary | ICD-10-CM | POA: Diagnosis not present

## 2023-11-18 DIAGNOSIS — Z113 Encounter for screening for infections with a predominantly sexual mode of transmission: Secondary | ICD-10-CM

## 2023-11-18 DIAGNOSIS — Z114 Encounter for screening for human immunodeficiency virus [HIV]: Secondary | ICD-10-CM | POA: Diagnosis not present

## 2023-11-18 MED ORDER — ESTRADIOL 0.1 MG/GM VA CREA: TOPICAL_CREAM | VAGINAL | 2 refills | Status: AC

## 2023-11-18 NOTE — Patient Instructions (Signed)
 EXERCISE AND DIET:  We recommended that you start or continue a regular exercise program for good health. Regular exercise means any activity that makes your heart beat faster and makes you sweat.  We recommend exercising at least 30 minutes per day at least 3 days a week, preferably 4 or 5.  We also recommend a diet low in fat and sugar.  Inactivity, poor dietary choices and obesity can cause diabetes, heart attack, stroke, and kidney damage, among others.    ALCOHOL AND SMOKING:  Women should limit their alcohol intake to no more than 7 drinks/beers/glasses of wine (combined, not each!) per week. Moderation of alcohol intake to this level decreases your risk of breast cancer and liver damage. And of course, no recreational drugs are part of a healthy lifestyle.  And absolutely no smoking or even second hand smoke. Most people know smoking can cause heart and lung diseases, but did you know it also contributes to weakening of your bones? Aging of your skin?  Yellowing of your teeth and nails?  CALCIUM AND VITAMIN D:  Adequate intake of calcium and Vitamin D are recommended.  The recommendations for exact amounts of these supplements seem to change often, but generally speaking 600 mg of calcium (either carbonate or citrate) and 800 units of Vitamin D per day seems prudent. Certain women may benefit from higher intake of Vitamin D.  If you are among these women, your doctor will have told you during your visit.    PAP SMEARS:  Pap smears, to check for cervical cancer or precancers,  have traditionally been done yearly, although recent scientific advances have shown that most women can have pap smears less often.  However, every woman still should have a physical exam from her gynecologist every year. It will include a breast check, inspection of the vulva and vagina to check for abnormal growths or skin changes, a visual exam of the cervix, and then an exam to evaluate the size and shape of the uterus and  ovaries.  And after 67 years of age, a rectal exam is indicated to check for rectal cancers. We will also provide age appropriate advice regarding health maintenance, like when you should have certain vaccines, screening for sexually transmitted diseases, bone density testing, colonoscopy, mammograms, etc.   MAMMOGRAMS:  All women over 52 years old should have a yearly mammogram. Many facilities now offer a "3D" mammogram, which may cost around $50 extra out of pocket. If possible,  we recommend you accept the option to have the 3D mammogram performed.  It both reduces the number of women who will be called back for extra views which then turn out to be normal, and it is better than the routine mammogram at detecting truly abnormal areas.    COLONOSCOPY:  Colonoscopy to screen for colon cancer is recommended for all women at age 33.  We know, you hate the idea of the prep.  We agree, BUT, having colon cancer and not knowing it is worse!!  Colon cancer so often starts as a polyp that can be seen and removed at colonscopy, which can quite literally save your life!  And if your first colonoscopy is normal and you have no family history of colon cancer, most women don't have to have it again for 10 years.  Once every ten years, you can do something that may end up saving your life, right?  We will be happy to help you get it scheduled when you are ready.  Be sure to check your insurance coverage so you understand how much it will cost.  It may be covered as a preventative service at no cost, but you should check your particular policy.    Calcium in Foods Calcium is a mineral in the body. Of all the minerals in your body, you have the most calcium. Most of the body's calcium supply is stored in bones and teeth. Calcium helps many parts of the body work, including: Blood and blood vessels. Nerves. Hormones. Muscles. Bones and teeth. When your calcium stores are low, you may be at risk for low bone mass, bone  loss, and broken bones. When you get enough calcium, it helps to support strong bones and teeth throughout your life. Calcium is especially important for: Children during growth spurts. Females during adolescence. Females who are pregnant or breastfeeding. Females after their menstrual cycle stops (postmenopausal). Females whose menstrual cycle has stopped because of an eating disorder or regular intense exercise. People who can't eat or digest dairy products. People who eat a vegan diet. Recommended daily amounts of calcium: Females (ages 5 to 24): 1,000 mg per day. Females (ages 42 and older): 1,200 mg per day. Males (ages 89 to 70): 1,000 mg per day. Males (ages 70 and older): 1,200 mg per day. Females (ages 40 to 31): 1,300 mg per day. Males (ages 87 to 15): 1,300 mg per day. General information Eat foods that are high in calcium. Try to get most of your calcium from food. Some people may benefit from taking calcium supplements. Check with your health care provider or an expert in healthy eating called a dietitian before starting any calcium supplements. Calcium supplements may interact with certain medicines. Too much calcium may cause other health problems, such as trouble pooping and kidney stones. For the body to absorb calcium, it needs vitamin D. Sources of vitamin D include: Skin exposure to direct sunlight. Foods, such as egg yolks, liver, mushrooms, saltwater fish, and fortified milk. Vitamin D supplements. Check with your provider or dietitian before starting any vitamin D supplements. The amount of calcium that is absorbed in the body varies with type of food. Talk to a dietitian about what foods are best for you, especially if you are eat a vegan diet or don't eat dairy. What foods are high in calcium?  Foods that are high in calcium contain more than 100 milligrams per serving. Fruits Fortified orange juice or other fruit juice, 300 mg per 8 oz (237 mL)  serving. Vegetables Collard greens, 260 mg per 1 cup (130 g) serving, cooked. Kale, 180 mg per 1 cup (118 g) serving, cooked. Bok choy, 180 mg per 1 cup (170 g) serving, cooked Grains Fortified frozen waffles, 200 mg in 2 waffles. Oatmeal, 180 mg in 1 cup (234 g) serving, cooked. Fortified white bread, 175 mg per slice. Meats and other proteins Sardines, canned with bones, 350 mg per 3.75 oz (92 g) serving. Salmon, canned with bones, 168 mg per 3 oz (85 g) serving. Canned shrimp, 125 mg per 3 oz (85 g) serving. Baked beans, 120 mg per 1 cup (266 g) serving. Tofu, firm, made with calcium sulfate, 861 mg per  cup (126 g) serving. Dairy Yogurt, plain, low-fat, 448 mg per 1 cup (245 g) serving Nonfat milk, 300 mg per 1 cup (245 g) serving. American cheese, 145 mg per 1 oz (21 g) serving or 1 slice. Cheddar cheese, 200 mg per 1 oz (28 g) serving or 1 slice. [employer]  cheese 2%, 125 mg per  cup (113 g) serving. Fortified soy, rice, or almond milk, 300 mg per 1 cup (237 mL) serving. Mozzarella, part skim, 210 mg per 1 oz (21 g) serving. The items listed above may not be a complete list of foods high in calcium. Actual amounts of calcium may be different depending on processing. Contact a dietitian for more information. What foods are lower in calcium? Foods that are lower in calcium contain 50 mg or less per serving. Fruits Apple, 1 medium, about 6 mg. Banana, 1 medium, about 12 mg. Vegetables Lettuce, 19 mg per 1 cup (35 g) serving. Tomato, 1 small, about 11 mg. Grains Rice, white, 8 mg per  cup (79 g) serving. Boiled potatoes, 14 mg per 1 cup (160 g) serving. White bread, 6 mg per slice. Meats and other proteins Egg, 24 mg per 1 egg (50 g). Red meat, 7 mg per 4 oz (80 g) serving. Chicken, 17 mg per 4 oz (113 g) serving. Fish, cod, or trout, 20 mg per 4 oz (140 g) serving. Dairy Cream cheese, regular, 14 mg per 1 Tbsp (15 g) serving. Brie cheese, 50 mg per 1 oz (32 g)  serving. The items listed above may not be a complete list of foods lower in calcium. Actual amounts of calcium may be different depending on processing. Contact a dietitian for more information. This information is not intended to replace advice given to you by your health care provider. Make sure you discuss any questions you have with your health care provider. Document Revised: 02/23/2023 Document Reviewed: 02/23/2023 Elsevier Patient Education  2024 [employer].

## 2023-11-19 ENCOUNTER — Ambulatory Visit: Payer: Self-pay | Admitting: Obstetrics and Gynecology

## 2023-11-19 LAB — CERVICOVAGINAL ANCILLARY ONLY
Chlamydia: NEGATIVE
Comment: NEGATIVE
Comment: NEGATIVE
Comment: NORMAL
Neisseria Gonorrhea: NEGATIVE
Trichomonas: NEGATIVE

## 2023-11-19 LAB — HEPATITIS C ANTIBODY: Hepatitis C Ab: NONREACTIVE

## 2023-11-19 LAB — RPR: RPR Ser Ql: NONREACTIVE

## 2023-11-19 LAB — HIV ANTIBODY (ROUTINE TESTING W REFLEX): HIV 1&2 Ab, 4th Generation: NONREACTIVE

## 2023-12-02 DIAGNOSIS — M79671 Pain in right foot: Secondary | ICD-10-CM | POA: Diagnosis not present

## 2023-12-02 DIAGNOSIS — M2021 Hallux rigidus, right foot: Secondary | ICD-10-CM | POA: Diagnosis not present

## 2023-12-06 DIAGNOSIS — Z0100 Encounter for examination of eyes and vision without abnormal findings: Secondary | ICD-10-CM | POA: Diagnosis not present

## 2023-12-16 ENCOUNTER — Ambulatory Visit (HOSPITAL_BASED_OUTPATIENT_CLINIC_OR_DEPARTMENT_OTHER)
Admission: RE | Admit: 2023-12-16 | Discharge: 2023-12-16 | Disposition: A | Discharge status: Home / Self Care | Source: Ambulatory Visit | Attending: Obstetrics and Gynecology | Admitting: Obstetrics and Gynecology

## 2023-12-16 DIAGNOSIS — M8588 Other specified disorders of bone density and structure, other site: Secondary | ICD-10-CM | POA: Diagnosis not present

## 2023-12-16 DIAGNOSIS — Z780 Asymptomatic menopausal state: Secondary | ICD-10-CM | POA: Diagnosis not present

## 2024-04-29 DIAGNOSIS — Z23 Encounter for immunization: Secondary | ICD-10-CM | POA: Diagnosis not present

## 2024-04-29 DIAGNOSIS — E785 Hyperlipidemia, unspecified: Secondary | ICD-10-CM | POA: Diagnosis not present

## 2024-04-29 DIAGNOSIS — G4700 Insomnia, unspecified: Secondary | ICD-10-CM | POA: Diagnosis not present

## 2024-04-29 DIAGNOSIS — R7303 Prediabetes: Secondary | ICD-10-CM | POA: Diagnosis not present

## 2024-11-04 ENCOUNTER — Encounter: Admitting: Obstetrics and Gynecology
